# Patient Record
Sex: Male | Born: 1945
Health system: Southern US, Community
[De-identification: ages and names within clinical notes are randomized; demographics above are authoritative.]

## PROBLEM LIST (undated history)

## (undated) DIAGNOSIS — E785 Hyperlipidemia, unspecified: Secondary | ICD-10-CM

## (undated) DIAGNOSIS — I10 Essential (primary) hypertension: Secondary | ICD-10-CM

## (undated) HISTORY — PX: COLON SURGERY: SHX602

## (undated) HISTORY — DX: Hyperlipidemia, unspecified: E78.5

## (undated) HISTORY — PX: APPENDECTOMY: SHX54

---

## 2004-03-18 ENCOUNTER — Ambulatory Visit (HOSPITAL_COMMUNITY): Admission: RE | Admit: 2004-03-18 | Discharge: 2004-03-18 | Payer: Self-pay | Admitting: Internal Medicine

## 2007-10-07 ENCOUNTER — Ambulatory Visit (HOSPITAL_COMMUNITY): Admission: RE | Admit: 2007-10-07 | Discharge: 2007-10-07 | Payer: Self-pay | Admitting: Family Medicine

## 2007-10-14 ENCOUNTER — Ambulatory Visit: Payer: Self-pay | Admitting: Cardiology

## 2007-10-16 ENCOUNTER — Emergency Department (HOSPITAL_COMMUNITY): Admission: EM | Admit: 2007-10-16 | Discharge: 2007-10-16 | Payer: Self-pay | Admitting: Emergency Medicine

## 2007-10-17 ENCOUNTER — Ambulatory Visit: Payer: Self-pay | Admitting: Cardiology

## 2009-02-12 ENCOUNTER — Encounter: Payer: Self-pay | Admitting: Cardiology

## 2011-02-14 ENCOUNTER — Other Ambulatory Visit: Payer: Self-pay | Admitting: Family Medicine

## 2011-02-14 ENCOUNTER — Ambulatory Visit (HOSPITAL_COMMUNITY)
Admission: RE | Admit: 2011-02-14 | Discharge: 2011-02-14 | Disposition: A | Discharge status: Home / Self Care | Payer: BC Managed Care – PPO | Source: Ambulatory Visit | Attending: Family Medicine | Admitting: Family Medicine

## 2011-02-14 DIAGNOSIS — Z77090 Contact with and (suspected) exposure to asbestos: Secondary | ICD-10-CM

## 2011-02-14 NOTE — Procedures (Signed)
[facility]                              EXERCISE TREADMILL   Shane Young, Shane Young                    MRN:          [id number]  DATE:10/17/2007                            DOB:          [birth year]    REFERRING PHYSICIAN:  Scott A. Gerda Diss, MD   1. Treadmill exercise performed to a workload of 9 mets and a heart      rate of 151, 94% of the patient's age-predicted maximum.  Exercise      discontinued due to fatigue and mild dyspnea; no chest pain      reported.  2. Blood pressure increased from a resting value of 145/80 to 200/80      during exercise and 210/80 earlier in recovery, a minimally      hypertensive response.  No arrhythmias noted.  3. EKG:  Normal sinus rhythm; borderline left atrial abnormality;      right ventricular conduction delay; otherwise normal.   Stress EKG:  No significant change.   IMPRESSION:  Negative and adequate graded exercise test revealing no  evidence for myocardial ischemia.     Gerrit Friends. Dietrich Pates, MD, [facility]  Electronically Signed    RMR/MedQ  DD: 10/17/2007  DT: 10/17/2007  Job #: [id number]

## 2011-02-14 NOTE — Letter (Signed)
October 14, 2007    Scott A. Gerda Diss, MD  [address]  [place], [place] [address]   RE:  Shane Young, Shane Young  MRN:  [id number]  /  DOB:  1945-12-24   Dear Shane Young:   It was my pleasure evaluating [name] in the office today in  consultation at her request.  As you know, this nice gentleman has  enjoyed generally excellent health.  In recent weeks, he has experienced  two episodes of substernal chest discomfort.  These occurred at rest.  He describes the sensation as heartburn, but denies that the quality  is burning.  He has a difficult time describing exactly what the nature  of the sensation was.  There were no associated symptoms.  There was no  radiation.  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   [name] has never previously been evaluated by a cardiologist.  He  has not been told of any heart problems, nor has he had any prior  cardiac testing.  His general health is good.  He has had borderline  hypertension treated with very low dose lisinopril.  He had underwent  appendectomy as a child and intestinal surgery of some sort when he was  65 years of age.  He has had colonoscopy in recent years.   His only medicine in addition to lisinopril is aspirin.  He has no known  drug allergies.   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  He is  married with two children.  He has a remote history of perhaps 20 pack-  years of cigarette smoking.  He does not use excessive amounts of  alcohol.   FAMILY HISTORY:  Father is alive and well.  Mother died at age 33 due to  COPD.  One sister is alive and well.   REVIEW OF SYSTEMS:  Notable for the need for corrective lenses, stable  weight and appetite.  All other systems reviewed and are negative.   EXAM:  Pleasant trim  tanned gentleman in no acute distress.  The weight is 192.  Blood pressure 125/85, heart rate 75 and regular,  respirations 16.  NECK:  No jugular venous distention; normal carotid upstrokes without  bruits.  Afebrile.  EENT:  EOMs full; normal oral mucosa.  ENDOCRINE:  No thyromegaly.  HEMATOPOIETIC:  No adenopathy.  SKIN:  No significant lesions.  LUNGS:  Clear.  CARDIAC:  Normal first and second heart sounds; fourth heart sound  present; normal PMI.  ABDOMEN:  Soft and nontender; no organomegaly.  EXTREMITIES:  No edema.  NEUROLOGIC:  Symmetric strength; normal tone; normal cranial nerves.   Laboratory available from your office includes a normal CBC, normal  chemistry profile, normal amylase and normal cardiac markers.  The  patient reports that you advised him he will need lipid lowering  therapy, but your office insists that no lipid profiles are available.   EKG:  Normal sinus rhythm; leftward axis.  Nondiagnostic lateral Q  waves; incomplete right bundle branch block; left atrial abnormality;  slightly delayed R wave progression.  No tracing for comparison.   IMPRESSION:  [name] presents with two episodes of chest  discomfort but with generally excellent exercise tolerance and low  cardiovascular risk.  We will proceed with  a standard treadmill test,  which I fully expect to be negative.  If so, I would not proceed with  additional diagnostic testing.  I have recommended antacid should his  symptoms recur.  Thanks so much for sending this nice gentleman to me.    Sincerely,      Gerrit Friends. Dietrich Pates, MD, [facility]  Electronically Signed    RMR/MedQ  DD: 10/14/2007  DT: 10/14/2007  Job #: [id number]

## 2011-02-17 NOTE — Op Note (Signed)
NAME:  Shane Young, Shane Young                       ACCOUNT NO.:  [id number]   MEDICAL RECORD NO.:  [id number]                   PATIENT TYPE:  AMB   LOCATION:  DAY                                  FACILITY:  [facility]   PHYSICIAN:  R. Roetta Sessions, M.D.              DATE OF BIRTH:  1946/04/15   DATE OF PROCEDURE:  03/18/2004  DATE OF DISCHARGE:                                 OPERATIVE REPORT   PROCEDURE:  Colonoscopy with biopsy.   ENDOSCOPIST:  Gerrit Friends. Rourk, M.D.   INDICATIONS FOR PROCEDURE:  The patient is a 65 year old gentleman who comes  for colorectal cancer screening.  He had a sigmoidoscopy back in 1999 or so  without significant findings.  No family history of colorectal neoplasia. He  has never had a full colonoscopy.  He is devoid of any lower GI tract  symptoms.  Colonoscopy is now being done as a screening maneuver.  This  approach has been discussed with the patient at length. The potential risks,  benefits, and alternatives have been reviewed; questions answered.   PROCEDURE NOTE:  O2 saturation, blood pressure, pulse and respirations were  monitored throughout the entire procedure.  Conscious sedation:  Demerol 100  mg, Versed 4 mg in divided doses.   INSTRUMENT:  Olympus video chip system.   FINDINGS:  Digital rectal exam revealed no abnormalities.   ENDOSCOPIC FINDINGS:  The prep was good.   RECTUM:  Examination of the rectal mucosa including the retroflex view of  the anal verge revealed no abnormalities.   COLON:  The colonic mucosa was surveyed from the rectosigmoid junction  through the left transverse and right colon to the area of the appendiceal  orifice, ileocecal valve, and cecum.  These structures were well seen and  photographed for the record.   From this level the scope was slowly withdrawn.  All previously mentioned  mucosal surfaces were again seen.  The patient was noted to have a 3-mm  polyp at 30 cm which was cold biopsied/removed.  The  remainder of colonic  mucosa appeared normal.   The patient tolerated the procedure well and was reacted in endoscopy.   IMPRESSION:  1. Normal rectum.  2. Diminutive polyp at 30 cm cold biopsied/removed.  3. The remainder of the colonic mucosa appeared normal.   RECOMMENDATIONS:  1. Follow up on path.  2. Further recommendations to follow.      ___________________________________________                                            Jonathon Bellows, M.D.   RMR/MEDQ  D:  03/18/2004  T:  03/19/2004  Job:  [id number]   cc:   Dr. Cecille Aver. Roetta Sessions, M.D.  [address]  [place]  [place] [address]  Fax:  [phone] 

## 2011-06-22 LAB — CBC
MCV: 88
RBC: 5.23
WBC: 5.8

## 2011-06-22 LAB — DIFFERENTIAL
Eosinophils Absolute: 0.2
Lymphs Abs: 1.6
Monocytes Relative: 10
Neutro Abs: 3.4
Neutrophils Relative %: 58

## 2011-06-22 LAB — BASIC METABOLIC PANEL
CO2: 27
Calcium: 9.1
Potassium: 4.1
Sodium: 141

## 2011-06-22 LAB — POCT CARDIAC MARKERS
CKMB, poc: <1 — ABNORMAL LOW
Myoglobin, poc: 39.9
Myoglobin, poc: 42.8
Operator id: 218581

## 2013-01-27 ENCOUNTER — Telehealth: Payer: Self-pay | Admitting: Family Medicine

## 2013-01-27 NOTE — Telephone Encounter (Signed)
Patient needs BW paperwork mailed to him

## 2013-01-28 ENCOUNTER — Encounter: Payer: Self-pay | Admitting: *Deleted

## 2013-01-28 ENCOUNTER — Telehealth: Payer: Self-pay | Admitting: *Deleted

## 2013-01-28 ENCOUNTER — Other Ambulatory Visit: Payer: Self-pay | Admitting: *Deleted

## 2013-01-28 DIAGNOSIS — Z0000 Encounter for general adult medical examination without abnormal findings: Secondary | ICD-10-CM

## 2013-01-28 DIAGNOSIS — R739 Hyperglycemia, unspecified: Secondary | ICD-10-CM

## 2013-01-28 DIAGNOSIS — E785 Hyperlipidemia, unspecified: Secondary | ICD-10-CM

## 2013-01-28 DIAGNOSIS — Z125 Encounter for screening for malignant neoplasm of prostate: Secondary | ICD-10-CM

## 2013-01-28 NOTE — Telephone Encounter (Signed)
Left message to pt of blood work papers ordered

## 2013-01-28 NOTE — Telephone Encounter (Signed)
Metabolic 7, lipid profile, liver, hemoglobin A1c, PSA. Patient is Medicare age.diagnosis hyperlipidemia, hyperglycemia, screening for prostate cancer.

## 2013-02-11 ENCOUNTER — Other Ambulatory Visit: Payer: Self-pay | Admitting: Family Medicine

## 2013-02-11 LAB — HEPATIC FUNCTION PANEL
ALT: 20 U/L (ref 0–53)
AST: 18 U/L (ref 0–37)
Bilirubin, Direct: 0.1 mg/dL (ref 0.0–0.3)
Indirect Bilirubin: 0.5 mg/dL (ref 0.0–0.9)

## 2013-02-11 LAB — BASIC METABOLIC PANEL
BUN: 19 mg/dL (ref 6–23)
Calcium: 9.9 mg/dL (ref 8.4–10.5)
Creat: 0.94 mg/dL (ref 0.50–1.35)
Glucose, Bld: 98 mg/dL (ref 70–99)

## 2013-02-11 LAB — LIPID PANEL: Cholesterol: 229 mg/dL — ABNORMAL HIGH (ref 0–200)

## 2013-02-11 LAB — HEMOGLOBIN A1C: Mean Plasma Glucose: 105 mg/dL (ref <117)

## 2013-02-19 ENCOUNTER — Encounter: Payer: Self-pay | Admitting: Family Medicine

## 2013-02-19 ENCOUNTER — Ambulatory Visit (INDEPENDENT_AMBULATORY_CARE_PROVIDER_SITE_OTHER): Payer: MEDICARE | Admitting: Family Medicine

## 2013-02-19 VITALS — BP 132/77 | HR 80 | Ht 69.0 in | Wt 179.4 lb

## 2013-02-19 DIAGNOSIS — Z125 Encounter for screening for malignant neoplasm of prostate: Secondary | ICD-10-CM

## 2013-02-19 DIAGNOSIS — E785 Hyperlipidemia, unspecified: Secondary | ICD-10-CM | POA: Insufficient documentation

## 2013-02-19 DIAGNOSIS — I10 Essential (primary) hypertension: Secondary | ICD-10-CM

## 2013-02-19 DIAGNOSIS — Z0000 Encounter for general adult medical examination without abnormal findings: Secondary | ICD-10-CM

## 2013-02-19 MED ORDER — MOMETASONE FUROATE 0.1 % EX CREA: TOPICAL_CREAM | Freq: Every day | CUTANEOUS | Status: DC

## 2013-02-19 NOTE — Progress Notes (Signed)
  Subjective:    Patient ID: Shane Young, male    DOB: 1946/04/12, 67 y.o.   MRN: [id number]  HPIPatient denies any particular troubles. He does state he tries the healthy. He does have a spot on his hand he was concerned about. He also statesHe also has a dermatitis on his right lower leg that bothers him. He denies any sweats chills vomiting. Past medical history hyperlipidemia,Hypertension. Dietary measures, safety measures, exercise all reviewed. Patient does not smoke. They states in the past he was exposed to asbestos x-ray in 2012 looked good    Review of Systems  Constitutional: Negative for fever, activity change and appetite change.  HENT: Negative for congestion, rhinorrhea and neck pain.   Eyes: Negative for discharge.  Respiratory: Negative for cough and wheezing.   Cardiovascular: Negative for chest pain.  Gastrointestinal: Negative for vomiting, abdominal pain, constipation and blood in stool.  Genitourinary: Negative for frequency, hematuria and difficulty urinating.  Skin: Negative for rash.  Allergic/Immunologic: Negative for environmental allergies and food allergies.  Neurological: Negative for weakness and headaches.  Psychiatric/Behavioral: Negative for agitation.       Objective:   Physical Exam  Vitals reviewed. Constitutional: He is oriented to person, place, and time. He appears well-developed. No distress.  HENT:  Head: Normocephalic.  Neck: Normal range of motion. No JVD present.  Cardiovascular: Normal rate, regular rhythm and normal heart sounds.   No murmur heard. Pulmonary/Chest: Effort normal and breath sounds normal. No respiratory distress.  Abdominal: Soft. Bowel sounds are normal. There is tenderness.  Genitourinary: Rectum normal, prostate normal and penis normal.  Musculoskeletal: Normal range of motion.  Neurological: He is alert and oriented to person, place, and time.  Skin: Skin is warm and dry.          Assessment & Plan:   #1 wellness overall doing good continue regular activity physical activity dietary measures safety measures. #2 acanthoma on the right hand I recommend That he set himself up with Dr. Margo Aye for removal of this #3Blood pressure stable no changes #4 hyperlipidemia patient does not want to be on medication will watch diet #5 prostate exam is normal PSA ordered await the results #6 right leg contact dermatitis/atopic dermatitis-Elocon cream as directed.

## 2013-02-19 NOTE — Patient Instructions (Signed)
  Thank you for enrolling in MyChart. Please follow the instructions below to securely access your online medical record. MyChart allows you to send messages to your doctor, view your test results, manage appointments, and more.   How Do I Sign Up? 1. In your Internet browser, go to [employer] and enter [email or web address]. 2. Click on the Sign Up Now link in the Sign In box. You will see the New Member Sign Up page. 3. Enter your MyChart Access Code exactly as it appears below. You will not need to use this code after you've completed the sign-up process. If you do not sign up before the expiration date, you must request a new code. MyChart Access Code: [email or web address] Expires: 03/21/2013 11:59 AM  4. Enter your Social Security Number ([id number]) and Date of Birth (mm/dd/yyyy) as indicated and click Submit. You will be taken to the next sign-up page. 5. Create a MyChart ID. This will be your MyChart login ID and cannot be changed, so think of one that is secure and easy to remember. 6. Create a MyChart password. You can change your password at any time. 7. Enter your Password Reset Question and Answer. This can be used at a later time if you forget your password.  8. Enter your e-mail address. You will receive e-mail notification when new information is available in MyChart. 9. Click Sign Up. You can now view your medical record.   Additional Information Remember, MyChart is NOT to be used for urgent needs. For medical emergencies, dial 911.

## 2013-03-03 ENCOUNTER — Telehealth: Payer: Self-pay | Admitting: Family Medicine

## 2013-03-03 NOTE — Telephone Encounter (Signed)
Rx written and faxed to [facility].

## 2013-03-03 NOTE — Telephone Encounter (Signed)
lisinopril (PRINIVIL,ZESTRIL) 5 MG tablet  See chart for fax form to [facility]

## 2013-03-13 ENCOUNTER — Telehealth: Payer: Self-pay | Admitting: Family Medicine

## 2013-03-13 MED ORDER — LISINOPRIL 5 MG PO TABS: 5.0000 mg | ORAL_TABLET | Freq: Every day | ORAL | Status: DC

## 2013-03-13 NOTE — Telephone Encounter (Signed)
[facility] to [facility]. Lisinopril #90 one qd with one refill. Pt notified on voicemail

## 2013-03-13 NOTE — Telephone Encounter (Signed)
Patient says that [facility] says they did not receive the fax for his lisinopril.Shane Young Please recall this in .Shane Young Phone number [phone]

## 2013-08-17 ENCOUNTER — Other Ambulatory Visit: Payer: Self-pay | Admitting: Family Medicine

## 2013-11-15 ENCOUNTER — Other Ambulatory Visit: Payer: Self-pay | Admitting: Family Medicine

## 2013-12-01 ENCOUNTER — Telehealth: Payer: Self-pay | Admitting: Family Medicine

## 2013-12-01 DIAGNOSIS — E785 Hyperlipidemia, unspecified: Secondary | ICD-10-CM

## 2013-12-01 DIAGNOSIS — I10 Essential (primary) hypertension: Secondary | ICD-10-CM

## 2013-12-01 NOTE — Telephone Encounter (Signed)
Metabolic 7, lipid profile. (Not time yet for a PSA)

## 2013-12-01 NOTE — Telephone Encounter (Signed)
On 5/14: Lip, Liv, Met7, PSA, A1C

## 2013-12-01 NOTE — Telephone Encounter (Signed)
Pt bw orders for appt on 3/13

## 2013-12-02 ENCOUNTER — Other Ambulatory Visit: Payer: Self-pay | Admitting: *Deleted

## 2013-12-02 DIAGNOSIS — E785 Hyperlipidemia, unspecified: Secondary | ICD-10-CM

## 2013-12-02 DIAGNOSIS — I10 Essential (primary) hypertension: Secondary | ICD-10-CM

## 2013-12-02 NOTE — Telephone Encounter (Signed)
Notified patient via VM stating blood work orders are in. 

## 2013-12-08 LAB — BASIC METABOLIC PANEL
BUN: 20 mg/dL (ref 6–23)
CALCIUM: 9.3 mg/dL (ref 8.4–10.5)
CO2: 27 meq/L (ref 19–32)
CREATININE: 1.06 mg/dL (ref 0.50–1.35)
Chloride: 105 mEq/L (ref 96–112)
Glucose, Bld: 97 mg/dL (ref 70–99)
Potassium: 4.4 mEq/L (ref 3.5–5.3)
SODIUM: 140 meq/L (ref 135–145)

## 2013-12-08 LAB — LIPID PANEL
CHOLESTEROL: 230 mg/dL — AB (ref 0–200)
HDL: 47 mg/dL (ref >39)
LDL Cholesterol: 156 mg/dL — ABNORMAL HIGH (ref 0–99)
Total CHOL/HDL Ratio: 4.9 Ratio
Triglycerides: 135 mg/dL (ref <150)
VLDL: 27 mg/dL (ref 0–40)

## 2013-12-12 ENCOUNTER — Ambulatory Visit (INDEPENDENT_AMBULATORY_CARE_PROVIDER_SITE_OTHER): Payer: MEDICARE | Admitting: Family Medicine

## 2013-12-12 ENCOUNTER — Encounter: Payer: Self-pay | Admitting: Family Medicine

## 2013-12-12 VITALS — BP 126/78 | Ht 69.0 in | Wt 183.0 lb

## 2013-12-12 DIAGNOSIS — I10 Essential (primary) hypertension: Secondary | ICD-10-CM

## 2013-12-12 DIAGNOSIS — E785 Hyperlipidemia, unspecified: Secondary | ICD-10-CM

## 2013-12-12 MED ORDER — ATORVASTATIN CALCIUM 10 MG PO TABS: 10.0000 mg | ORAL_TABLET | Freq: Every day | ORAL | Status: DC

## 2013-12-12 NOTE — Progress Notes (Signed)
   Subjective:    Patient ID: Shane Young, male    DOB: 1946-01-19, 68 y.o.   MRN: [id number]  HPI Patient is here today for a check up.  He recently had his BW done.   No concerns/questions.  Denies any wheezing difficulty breathing he states he is trying to stay healthy. His lab work was discussed in detail including how his LDL is higher than what it should be. Warnings regarding how this could lead to heart disease and strokes was discussed he is at a 9.6% of major of the in the next 10 years   Review of Systems He denies chest tightness pressure pain shortness breath nausea vomiting diarrhea.    Objective:   Physical Exam Lungs clear heart regular pulse normal blood pressure recheck is good extremities no edema       Assessment & Plan:  #1 hyperlipidemia-according to the [employer] of [employer] he is at increased risk of a heart disease. I recommended that he change aspirin. Use 81 mg daily. Also start statin Lipitor 10 mg every single day. He is to start off 3 days per week for the first 2 weeks then 1 daily he is to check lipid liver profile for the in of May with a followup office visit in June 2 discussed.  #2 HTN good control

## 2013-12-12 NOTE — Patient Instructions (Signed)
Your cholesterol is higher than we need to be  Start Lipitor 10 mg- to begin one M W and Friday for 2 weeks Then one daily  Recheck labs end of May with follow up in June  Call us for labs near May 15th

## 2013-12-14 ENCOUNTER — Other Ambulatory Visit: Payer: Self-pay | Admitting: Family Medicine

## 2013-12-15 ENCOUNTER — Encounter: Payer: Self-pay | Admitting: *Deleted

## 2013-12-15 ENCOUNTER — Other Ambulatory Visit: Payer: Self-pay | Admitting: *Deleted

## 2013-12-18 ENCOUNTER — Telehealth: Payer: Self-pay | Admitting: Family Medicine

## 2013-12-18 MED ORDER — LISINOPRIL 5 MG PO TABS: ORAL_TABLET | ORAL | Status: DC

## 2013-12-18 NOTE — Telephone Encounter (Signed)
Patient needs Rx for lisinopril for a 90 day supply. [facility]

## 2013-12-18 NOTE — Telephone Encounter (Signed)
Rx sent electronically to pharmacy. Patient notified. 

## 2014-02-17 ENCOUNTER — Telehealth: Payer: Self-pay | Admitting: Family Medicine

## 2014-02-17 DIAGNOSIS — E782 Mixed hyperlipidemia: Secondary | ICD-10-CM

## 2014-02-17 DIAGNOSIS — Z125 Encounter for screening for malignant neoplasm of prostate: Secondary | ICD-10-CM

## 2014-02-17 NOTE — Telephone Encounter (Signed)
bw orders for appt on 6/11  Call when ready (spouse ordering hers as well so you can make 1 phone call)

## 2014-02-17 NOTE — Telephone Encounter (Signed)
PSA, lipid profile-diagnosis hyperlipidemia and screening

## 2014-02-17 NOTE — Telephone Encounter (Signed)
Left message on voicemail notifying patient that blood work has been ordered.  

## 2014-02-17 NOTE — Telephone Encounter (Signed)
Last labs 12/08/13 Lipid, Met 7. Repeat?

## 2014-03-07 ENCOUNTER — Other Ambulatory Visit: Payer: Self-pay | Admitting: Family Medicine

## 2014-03-07 LAB — LIPID PANEL
Cholesterol: 124 mg/dL (ref 0–200)
HDL: 40 mg/dL (ref >39)
LDL Cholesterol: 74 mg/dL (ref 0–99)
Total CHOL/HDL Ratio: 3.1 Ratio
Triglycerides: 51 mg/dL (ref <150)
VLDL: 10 mg/dL (ref 0–40)

## 2014-03-09 LAB — PSA: PSA: 1.89 ng/mL (ref <=4.00)

## 2014-03-12 ENCOUNTER — Ambulatory Visit (INDEPENDENT_AMBULATORY_CARE_PROVIDER_SITE_OTHER): Payer: MEDICARE | Admitting: Family Medicine

## 2014-03-12 ENCOUNTER — Encounter: Payer: Self-pay | Admitting: Family Medicine

## 2014-03-12 VITALS — BP 130/80 | Ht 69.0 in | Wt 176.2 lb

## 2014-03-12 DIAGNOSIS — Z23 Encounter for immunization: Secondary | ICD-10-CM

## 2014-03-12 DIAGNOSIS — I10 Essential (primary) hypertension: Secondary | ICD-10-CM

## 2014-03-12 DIAGNOSIS — E785 Hyperlipidemia, unspecified: Secondary | ICD-10-CM

## 2014-03-12 LAB — HEPATIC FUNCTION PANEL
ALBUMIN: 4.2 g/dL (ref 3.5–5.2)
ALT: 18 U/L (ref 0–53)
AST: 17 U/L (ref 0–37)
Alkaline Phosphatase: 53 U/L (ref 39–117)
BILIRUBIN TOTAL: 0.4 mg/dL (ref 0.2–1.2)
Bilirubin, Direct: 0.1 mg/dL (ref 0.0–0.3)
Indirect Bilirubin: 0.3 mg/dL (ref 0.2–1.2)
Total Protein: 6.6 g/dL (ref 6.0–8.3)

## 2014-03-12 MED ORDER — ATORVASTATIN CALCIUM 10 MG PO TABS: 10.0000 mg | ORAL_TABLET | Freq: Every day | ORAL | Status: DC

## 2014-03-12 MED ORDER — LISINOPRIL 5 MG PO TABS: ORAL_TABLET | ORAL | Status: DC

## 2014-03-12 NOTE — Progress Notes (Signed)
   Subjective:    Patient ID: Shane Young, male    DOB: 1946-09-21, 67 y.o.   MRN: [id number]  Hyperlipidemia This is a new problem. The current episode started more than 1 month ago. There are no known factors aggravating his hyperlipidemia. Pertinent negatives include no chest pain. Current antihyperlipidemic treatment includes statins. The current treatment provides significant improvement of lipids. There are no compliance problems.  Risk factors for coronary artery disease include hypertension.   Patient states that he has no concerns at this time.     Review of Systems  Constitutional: Negative for activity change, appetite change and fatigue.  HENT: Negative for congestion.   Respiratory: Negative for cough.   Cardiovascular: Negative for chest pain.  Gastrointestinal: Negative for abdominal pain.  Endocrine: Negative for polydipsia and polyphagia.  Neurological: Negative for weakness.  Psychiatric/Behavioral: Negative for confusion.       Objective:   Physical Exam  Vitals reviewed. Constitutional: He appears well-nourished. No distress.  Cardiovascular: Normal rate, regular rhythm and normal heart sounds.   No murmur heard. Pulmonary/Chest: Effort normal and breath sounds normal. No respiratory distress.  Genitourinary: Prostate normal.  Musculoskeletal: He exhibits no edema.  Lymphadenopathy:    He has no cervical adenopathy.  Neurological: He is alert.  Psychiatric: His behavior is normal.          Assessment & Plan:  1. Need   prophylactic vaccination against Streptococcus pneumoniae (pneumococcus) Flu shot today - Pneumococcal conjugate vaccine 13-valent IM  2. Essential hypertension, benign Blood pressure or doing well continue current medications  3. Hyperlipidemia Cholesterol doing great we'll recheck lipid liver profile several from now  Is due colonoscopy patient defers still

## 2014-07-09 ENCOUNTER — Telehealth: Payer: Self-pay

## 2014-07-09 NOTE — Telephone Encounter (Signed)
Pt's wife called today saying that she had called last Thursday about setting her husband up for a tcs per Dr Gerda DissLuking. I didn't see a phone note, but I told patient's wife that DS had been out and should return on Tuesday and I will make sure that she calls them back. Please call [phone]

## 2014-07-20 ENCOUNTER — Other Ambulatory Visit: Payer: Self-pay

## 2014-07-20 DIAGNOSIS — Z1211 Encounter for screening for malignant neoplasm of colon: Secondary | ICD-10-CM

## 2014-07-27 NOTE — Telephone Encounter (Signed)
Ok to schedule.

## 2014-07-27 NOTE — Telephone Encounter (Signed)
Gastroenterology Pre-Procedure Review  Request Date: 07/20/2014 Requesting Physician: Dr. Gerda DissLuking  PATIENT REVIEW QUESTIONS: The patient responded to the following health history questions as indicated:    Per wife, pt has approximately 2 beers daily  1. Diabetes Melitis: no 2. Joint replacements in the past 12 months: no 3. Major health problems in the past 3 months: no 4. Has an artificial valve or MVP: no 5. Has a defibrillator: no 6. Has been advised in past to take antibiotics in advance of a procedure like teeth cleaning: no    MEDICATIONS & ALLERGIES:    Patient reports the following regarding taking any blood thinners:   Plavix? no Aspirin? YES Coumadin? no  Patient confirms/reports the following medications:  Current Outpatient Prescriptions  Medication Sig Dispense Refill  . aspirin 81 MG tablet Take 81 mg by mouth daily.      Marland Kitchen. atorvastatin (LIPITOR) 10 MG tablet Take 1 tablet (10 mg total) by mouth daily.  90 tablet  2  . lisinopril (PRINIVIL,ZESTRIL) 5 MG tablet TAKE 1 TABLET DAILY  90 tablet  1  . mometasone (ELOCON) 0.1 % cream Apply topically daily.  45 g  2   No current facility-administered medications for this visit.    Patient confirms/reports the following allergies:  No Known Allergies  No orders of the defined types were placed in this encounter.    AUTHORIZATION INFORMATION Primary Insurance:   ID #:   Group #:  Pre-Cert / Auth required: Pre-Cert / Auth #:   Secondary Insurance:   ID #:   Group #:  Pre-Cert / Auth required Pre-Cert / Auth #:   SCHEDULE INFORMATION: Procedure has been scheduled as follows:  Date: 08/17/2014                  Time: 8:30 AM  Location[facility] Stay  This Gastroenterology Pre-Precedure Review Form is being routed to the following provider(s): R. Roetta SessionsMichael Rourk, MD

## 2014-07-27 NOTE — Addendum Note (Signed)
Addended by: Tiffany KocherLEWIS, LESLIE S on: 07/27/2014 10:45 PM   Modules accepted: Orders

## 2014-07-29 MED ORDER — PEG-KCL-NACL-NASULF-NA ASC-C 100 G PO SOLR: 1.0000 | ORAL | Status: DC

## 2014-07-29 NOTE — Telephone Encounter (Signed)
Rx sent to the pharmacy and instructions mailed to pt.  

## 2014-07-29 NOTE — Addendum Note (Signed)
Addended by: Lavena BullionSTEWART, Braylie Badami H on: 07/29/2014 08:43 AM   Modules accepted: Orders

## 2014-08-17 ENCOUNTER — Encounter (HOSPITAL_COMMUNITY)
Admission: RE | Disposition: A | Discharge status: Home / Self Care | Payer: Self-pay | Source: Ambulatory Visit | Attending: Internal Medicine

## 2014-08-17 ENCOUNTER — Encounter (HOSPITAL_COMMUNITY): Payer: Self-pay

## 2014-08-17 ENCOUNTER — Ambulatory Visit (HOSPITAL_COMMUNITY)
Admission: RE | Admit: 2014-08-17 | Discharge: 2014-08-17 | Disposition: A | Discharge status: Home / Self Care | Payer: Medicare Other | Source: Ambulatory Visit | Attending: Internal Medicine | Admitting: Internal Medicine

## 2014-08-17 DIAGNOSIS — Z1211 Encounter for screening for malignant neoplasm of colon: Secondary | ICD-10-CM | POA: Diagnosis not present

## 2014-08-17 DIAGNOSIS — I10 Essential (primary) hypertension: Secondary | ICD-10-CM | POA: Diagnosis not present

## 2014-08-17 DIAGNOSIS — E785 Hyperlipidemia, unspecified: Secondary | ICD-10-CM | POA: Diagnosis not present

## 2014-08-17 DIAGNOSIS — Z7982 Long term (current) use of aspirin: Secondary | ICD-10-CM | POA: Insufficient documentation

## 2014-08-17 HISTORY — PX: COLONOSCOPY: SHX5424

## 2014-08-17 HISTORY — DX: Essential (primary) hypertension: I10

## 2014-08-17 LAB — HM COLONOSCOPY

## 2014-08-17 SURGERY — COLONOSCOPY
Anesthesia: Moderate Sedation

## 2014-08-17 MED ORDER — MEPERIDINE HCL 100 MG/ML IJ SOLN
INTRAMUSCULAR | Status: AC
  Filled 2014-08-17: qty 2

## 2014-08-17 MED ORDER — ONDANSETRON HCL 4 MG/2ML IJ SOLN
INTRAMUSCULAR | Status: AC
  Filled 2014-08-17: qty 2

## 2014-08-17 MED ORDER — MEPERIDINE HCL 100 MG/ML IJ SOLN
INTRAMUSCULAR | Status: DC | PRN
  Administered 2014-08-17: 25 mg
  Administered 2014-08-17: 50 mg

## 2014-08-17 MED ORDER — MIDAZOLAM HCL 5 MG/5ML IJ SOLN
INTRAMUSCULAR | Status: DC | PRN
  Administered 2014-08-17: 1 mg via INTRAVENOUS
  Administered 2014-08-17 (×2): 2 mg via INTRAVENOUS
  Administered 2014-08-17: 1 mg via INTRAVENOUS

## 2014-08-17 MED ORDER — SODIUM CHLORIDE 0.9 % IV SOLN
INTRAVENOUS | Status: DC
  Administered 2014-08-17: 08:00:00 via INTRAVENOUS

## 2014-08-17 MED ORDER — ONDANSETRON HCL 4 MG/2ML IJ SOLN
INTRAMUSCULAR | Status: DC | PRN
  Administered 2014-08-17: 4 mg via INTRAVENOUS

## 2014-08-17 MED ORDER — STERILE WATER FOR IRRIGATION IR SOLN
Status: DC | PRN
  Administered 2014-08-17: 10:00:00

## 2014-08-17 MED ORDER — MIDAZOLAM HCL 5 MG/5ML IJ SOLN
INTRAMUSCULAR | Status: AC
  Filled 2014-08-17: qty 10

## 2014-08-17 NOTE — H&P (Signed)
$'@LOGO'x$ @   Primary Care Physician:  Sallee Lange, MD Primary Gastroenterologist:  Dr. Gala Romney  Pre-Procedure History & Physical: HPI:  Shane Young is a 68 y.o. male is here for a screening colonoscopy. Small polyp biopsy/removal 10 years ago. Pathology unavailable.No GI symptoms.  No family history of colon cancer.  Past Medical History  Diagnosis Date  . Hyperlipidemia   . Hypertension     Past Surgical History  Procedure Laterality Date  . Appendectomy    . Colon surgery      55 years ago     Prior to Admission medications   Medication Sig Start Date End Date Taking? Authorizing Provider  aspirin 81 MG tablet Take 81 mg by mouth daily.   Yes Historical Provider, MD  atorvastatin (LIPITOR) 10 MG tablet Take 1 tablet (10 mg total) by mouth daily. 03/12/14  Yes Kathyrn Drown, MD  lisinopril (PRINIVIL,ZESTRIL) 5 MG tablet TAKE 1 TABLET DAILY Patient taking differently: Take 5 mg by mouth daily.  03/12/14  Yes Kathyrn Drown, MD  peg 3350 powder (MOVIPREP) 100 G SOLR Take 1 kit (200 g total) by mouth as directed. 07/29/14  Yes Daneil Dolin, MD    Allergies as of 07/20/2014  . (No Known Allergies)    Family History  Problem Relation Age of Onset  . Hypertension Mother   . Diabetes Mother     History   Social History  . Marital Status: Married    Spouse Name: N/A    Number of Children: N/A  . Years of Education: N/A   Occupational History  . Not on file.   Social History Main Topics  . Smoking status: Never Smoker   . Smokeless tobacco: Not on file  . Alcohol Use: Yes  . Drug Use: No  . Sexual Activity: Not on file   Other Topics Concern  . Not on file   Social History Narrative    Review of Systems: See HPI, otherwise negative ROS  Physical Exam: BP 140/79 mmHg  Pulse 81  Temp(Src) 97.7 F (36.5 C) (Oral)  Resp 22  Ht $R'5\' 9"'oe$  (1.753 m)  Wt 183 lb (83.008 kg)  BMI 27.01 kg/m2  SpO2 98% General:   Alert,  Well-developed, well-nourished,  pleasant and cooperative in NAD Head:  Normocephalic and atraumatic. Eyes:  Sclera clear, no icterus.   Conjunctiva pink. Ears:  Normal auditory acuity. Nose:  No deformity, discharge,  or lesions. Mouth:  No deformity or lesions, dentition normal. Neck:  Supple; no masses or thyromegaly. Lungs:  Clear throughout to auscultation.   No wheezes, crackles, or rhonchi. No acute distress. Heart:  Regular rate and rhythm; no murmurs, clicks, rubs,  or gallops. Abdomen:  Soft, nontender and nondistended. No masses, hepatosplenomegaly or hernias noted. Normal bowel sounds, without guarding, and without rebound.   Msk:  Symmetrical without gross deformities. Normal posture. Pulses:  Normal pulses noted. Extremities:  Without clubbing or edema. Neurologic:  Alert and  oriented x4;  grossly normal neurologically. Skin:  Intact without significant lesions or rashes. Cervical Nodes:  No significant cervical adenopathy. Psych:  Alert and cooperative. Normal mood and affect.  Impression/Plan: Shane Young is now here to undergo a screening colonoscopy.  Average risk screening examination Risks, benefits, limitations, imponderables and alternatives regarding colonoscopy have been reviewed with the patient. Questions have been answered. All parties agreeable.     Notice:  This dictation was prepared with Dragon dictation along with smaller phrase technology. Any transcriptional errors  that result from this process are unintentional and may not be corrected upon review.

## 2014-08-17 NOTE — Discharge Instructions (Addendum)
°  Colonoscopy Discharge Instructions  Read the instructions outlined below and refer to this sheet in the next few weeks. These discharge instructions provide you with general information on caring for yourself after you leave the hospital. Your doctor may also give you specific instructions. While your treatment has been planned according to the most current medical practices available, unavoidable complications occasionally occur. If you have any problems or questions after discharge, call Dr. Jena Gaussourk at [phone]. ACTIVITY  You may resume your regular activity, but move at a slower pace for the next 24 hours.   Take frequent rest periods for the next 24 hours.   Walking will help get rid of the air and reduce the bloated feeling in your belly (abdomen).   No driving for 24 hours (because of the medicine (anesthesia) used during the test).    Do not sign any important legal documents or operate any machinery for 24 hours (because of the anesthesia used during the test).  NUTRITION  Drink plenty of fluids.   You may resume your normal diet as instructed by your doctor.   Begin with a light meal and progress to your normal diet. Heavy or fried foods are harder to digest and may make you feel sick to your stomach (nauseated).   Avoid alcoholic beverages for 24 hours or as instructed.  MEDICATIONS  You may resume your normal medications unless your doctor tells you otherwise.  WHAT YOU CAN EXPECT TODAY  Some feelings of bloating in the abdomen.   Passage of more gas than usual.   Spotting of blood in your stool or on the toilet paper.  IF YOU HAD POLYPS REMOVED DURING THE COLONOSCOPY:  No aspirin products for 7 days or as instructed.   No alcohol for 7 days or as instructed.   Eat a soft diet for the next 24 hours.  FINDING OUT THE RESULTS OF YOUR TEST Not all test results are available during your visit. If your test results are not back during the visit, make an appointment  with your caregiver to find out the results. Do not assume everything is normal if you have not heard from your caregiver or the medical facility. It is important for you to follow up on all of your test results.  SEEK IMMEDIATE MEDICAL ATTENTION IF:  You have more than a spotting of blood in your stool.   Your belly is swollen (abdominal distention).   You are nauseated or vomiting.   You have a temperature over 101.   You have abdominal pain or discomfort that is severe or gets worse throughout the day.    Your colonoscopy was normal  I recommend one more colonoscopy in 10 years if your overall health permits

## 2014-08-17 NOTE — Op Note (Signed)
[facility] [address] [place], [id number]   COLONOSCOPY PROCEDURE REPORT  PATIENT: Shane LusterHenderson, Joshuah L  MR#: [id number] BIRTHDATE: 08-20-1946 , 68  yrs. old GENDER: male ENDOSCOPIST: R.  Roetta SessionsMichael Kassaundra Hair, MD FACP [facility] REFERRED GE:XBMWUBY:Scott Gerda DissLuking, M.D. PROCEDURE DATE:  08/17/2014 PROCEDURE:   Colonoscopy, screening INDICATIONS:Average risk colorectal cancer screening examination. MEDICATIONS: Versed 6 mg IV and Demerol 75 mg IV in divided doses. Zofran 4 mg IV. ASA CLASS:       Class II  CONSENT: The risks, benefits, alternatives and imponderables including but not limited to bleeding, perforation as well as the possibility of a missed lesion have been reviewed.  The potential for biopsy, lesion removal, etc. have also been discussed. Questions have been answered.  All parties agreeable.  Please see the history and physical in the medical record for more information.  DESCRIPTION OF PROCEDURE:   After the risks benefits and alternatives of the procedure were thoroughly explained, informed consent was obtained.  The digital rectal exam revealed no abnormalities of the rectum.   The EC-[id number] ([id number]  endoscope was introduced through the anus and advanced to the cecum, which was identified by both the appendix and ileocecal valve. No adverse events experienced.   The quality of the prep was adequate.  The instrument was then slowly withdrawn as the colon was fully examined.      COLON FINDINGS: Normal-appearing rectal mucosa.  Normal-appearing colonic mucosa.  Retroflexion was performed. .  Withdrawal time=9 minutes 0 seconds.  The scope was withdrawn and the procedure completed. COMPLICATIONS: There were no immediate complications.  ENDOSCOPIC IMPRESSION: Normal colonoscopy  RECOMMENDATIONS: One more screening colonoscopy in 10 years if overall health permits.  eSigned:  R. Roetta SessionsMichael Verley Pariseau, MD Jerrel IvoryFACP [facility] 08/17/2014 10:11 AM   cc:  CPT CODES: ICD  CODES:  The ICD and CPT codes recommended by this software are interpretations from the data that the clinical staff has captured with the software.  The verification of the translation of this report to the ICD and CPT codes and modifiers is the sole responsibility of the health care institution and practicing physician where this report was generated.  [employer] will not be held responsible for the validity of the ICD and CPT codes included on this report.  AMA assumes no liability for data contained or not contained herein. CPT is a Publishing rights managerregistered trademark of the [employer].

## 2014-08-18 ENCOUNTER — Encounter (HOSPITAL_COMMUNITY): Payer: Self-pay | Admitting: Internal Medicine

## 2014-08-18 ENCOUNTER — Telehealth: Payer: Self-pay | Admitting: Family Medicine

## 2014-08-18 DIAGNOSIS — Z79899 Other long term (current) drug therapy: Secondary | ICD-10-CM

## 2014-08-18 DIAGNOSIS — D649 Anemia, unspecified: Secondary | ICD-10-CM

## 2014-08-18 DIAGNOSIS — E785 Hyperlipidemia, unspecified: Secondary | ICD-10-CM

## 2014-08-18 NOTE — Telephone Encounter (Signed)
I would recommend lipid profile, metabolic 7, CBC-HTN, hyperlipidemia, use of aspirin ( high risk medicine could cause anemia)

## 2014-08-18 NOTE — Telephone Encounter (Signed)
Pt has appt 12/16 needs bw orders for appt  Please call when ready LM on VM if no answer   Hep PSA Lipid were last labs ran 6/6

## 2014-08-19 NOTE — Telephone Encounter (Signed)
Notified patient's wife that bloodwork has been ordered.

## 2014-08-22 ENCOUNTER — Other Ambulatory Visit: Payer: Self-pay | Admitting: Family Medicine

## 2014-09-01 LAB — CBC WITH DIFFERENTIAL/PLATELET
Basophils Absolute: 0 10*3/uL (ref 0.0–0.1)
Basophils Relative: 0 % (ref 0–1)
Eosinophils Absolute: 0.3 10*3/uL (ref 0.0–0.7)
Eosinophils Relative: 5 % (ref 0–5)
HEMATOCRIT: 44.8 % (ref 39.0–52.0)
Hemoglobin: 15.4 g/dL (ref 13.0–17.0)
Lymphocytes Relative: 29 % (ref 12–46)
Lymphs Abs: 1.8 10*3/uL (ref 0.7–4.0)
MCH: 30.1 pg (ref 26.0–34.0)
MCHC: 34.4 g/dL (ref 30.0–36.0)
MCV: 87.7 fL (ref 78.0–100.0)
MONO ABS: 0.6 10*3/uL (ref 0.1–1.0)
MONOS PCT: 10 % (ref 3–12)
MPV: 8.6 fL — ABNORMAL LOW (ref 9.4–12.4)
NEUTROS ABS: 3.5 10*3/uL (ref 1.7–7.7)
NEUTROS PCT: 56 % (ref 43–77)
Platelets: 287 10*3/uL (ref 150–400)
RBC: 5.11 MIL/uL (ref 4.22–5.81)
RDW: 13.2 % (ref 11.5–15.5)
WBC: 6.2 10*3/uL (ref 4.0–10.5)

## 2014-09-01 LAB — LIPID PANEL
CHOL/HDL RATIO: 2.6 ratio
Cholesterol: 129 mg/dL (ref 0–200)
HDL: 50 mg/dL (ref >39)
LDL CALC: 64 mg/dL (ref 0–99)
Triglycerides: 77 mg/dL (ref <150)
VLDL: 15 mg/dL (ref 0–40)

## 2014-09-01 LAB — BASIC METABOLIC PANEL
BUN: 12 mg/dL (ref 6–23)
CO2: 27 mEq/L (ref 19–32)
Calcium: 8.9 mg/dL (ref 8.4–10.5)
Chloride: 107 mEq/L (ref 96–112)
Creat: 1.01 mg/dL (ref 0.50–1.35)
GLUCOSE: 110 mg/dL — AB (ref 70–99)
Potassium: 4.7 mEq/L (ref 3.5–5.3)
SODIUM: 141 meq/L (ref 135–145)

## 2014-09-16 ENCOUNTER — Encounter: Payer: Self-pay | Admitting: Family Medicine

## 2014-09-16 ENCOUNTER — Ambulatory Visit (INDEPENDENT_AMBULATORY_CARE_PROVIDER_SITE_OTHER): Payer: MEDICARE | Admitting: Family Medicine

## 2014-09-16 VITALS — BP 138/72 | Ht 69.0 in | Wt 183.0 lb

## 2014-09-16 DIAGNOSIS — E785 Hyperlipidemia, unspecified: Secondary | ICD-10-CM

## 2014-09-16 DIAGNOSIS — I10 Essential (primary) hypertension: Secondary | ICD-10-CM

## 2014-09-16 DIAGNOSIS — R739 Hyperglycemia, unspecified: Secondary | ICD-10-CM

## 2014-09-16 LAB — POCT GLYCOSYLATED HEMOGLOBIN (HGB A1C): HEMOGLOBIN A1C: 5.4

## 2014-09-16 NOTE — Progress Notes (Signed)
   Subjective:    Patient ID: Shane Young, male    DOB: [birth year], 68 y.o.   MRN: [id number]  Hypertension This is a chronic problem. The current episode started more than 1 year ago. Pertinent negatives include no chest pain.   this patient does try to watch how he eats he he does admit to eating too much sugars and candies. Wants to go over BW results.  This patient [place] but does not do any walking or purposeful exercise he denies chest tightness pressure pain or shortness of breath he does take his cholesterol medicine and tolerates it well no muscle aches or pain he is up-to-date on his wellness. Patient does not do flu vaccine. He refuses it. Review of Systems  Constitutional: Negative for activity change, appetite change and fatigue.  HENT: Negative for congestion.   Respiratory: Negative for cough.   Cardiovascular: Negative for chest pain.  Gastrointestinal: Negative for abdominal pain.  Endocrine: Negative for polydipsia and polyphagia.  Neurological: Negative for weakness.  Psychiatric/Behavioral: Negative for confusion.       Objective:   Physical Exam  Constitutional: He appears well-nourished. No distress.  Cardiovascular: Normal rate, regular rhythm and normal heart sounds.   No murmur heard. Pulmonary/Chest: Effort normal and breath sounds normal. No respiratory distress.  Musculoskeletal: He exhibits no edema.  Lymphadenopathy:    He has no cervical adenopathy.  Neurological: He is alert.  Psychiatric: His behavior is normal.  Vitals reviewed.         Assessment & Plan:

## 2014-11-14 ENCOUNTER — Other Ambulatory Visit: Payer: Self-pay | Admitting: Family Medicine

## 2014-11-17 ENCOUNTER — Telehealth: Payer: Self-pay | Admitting: Family Medicine

## 2014-11-17 ENCOUNTER — Other Ambulatory Visit: Payer: Self-pay | Admitting: *Deleted

## 2014-11-17 MED ORDER — LISINOPRIL 5 MG PO TABS: 5.0000 mg | ORAL_TABLET | Freq: Every day | ORAL | Status: DC

## 2014-11-17 MED ORDER — ATORVASTATIN CALCIUM 10 MG PO TABS: 10.0000 mg | ORAL_TABLET | Freq: Every day | ORAL | Status: DC

## 2014-11-17 NOTE — Telephone Encounter (Signed)
Scripts placed in the mail. Pt's wife notified.

## 2014-11-17 NOTE — Telephone Encounter (Signed)
Patient changed pharmacy and needs new prescription on astorvastatin 10 mg 90 day supply, lisinopril 5 mg 90 day supply. Please mail prescriptions.

## 2015-03-16 ENCOUNTER — Encounter: Payer: Self-pay | Admitting: Family Medicine

## 2015-03-17 ENCOUNTER — Other Ambulatory Visit: Payer: Self-pay | Admitting: Family Medicine

## 2015-03-17 MED ORDER — ATORVASTATIN CALCIUM 10 MG PO TABS: 10.0000 mg | ORAL_TABLET | Freq: Every day | ORAL | Status: DC

## 2015-03-17 NOTE — Addendum Note (Signed)
Addended by: Dereck Ligas on: 03/17/2015 09:38 AM   Modules accepted: Orders

## 2015-03-17 NOTE — Telephone Encounter (Signed)
Needs office visit.

## 2015-04-10 ENCOUNTER — Other Ambulatory Visit: Payer: Self-pay | Admitting: Family Medicine

## 2015-04-12 NOTE — Telephone Encounter (Signed)
Needs office visit.

## 2015-04-13 ENCOUNTER — Telehealth: Payer: Self-pay | Admitting: Family Medicine

## 2015-04-13 DIAGNOSIS — I10 Essential (primary) hypertension: Secondary | ICD-10-CM

## 2015-04-13 DIAGNOSIS — R739 Hyperglycemia, unspecified: Secondary | ICD-10-CM

## 2015-04-13 DIAGNOSIS — Z125 Encounter for screening for malignant neoplasm of prostate: Secondary | ICD-10-CM

## 2015-04-13 DIAGNOSIS — Z79899 Other long term (current) drug therapy: Secondary | ICD-10-CM

## 2015-04-13 DIAGNOSIS — E785 Hyperlipidemia, unspecified: Secondary | ICD-10-CM

## 2015-04-13 NOTE — Telephone Encounter (Signed)
Also PSA 

## 2015-04-13 NOTE — Telephone Encounter (Signed)
bw orders ready. Pt notified. 

## 2015-04-13 NOTE — Telephone Encounter (Signed)
Lipid, liver, metabolic 7, hemoglobin A1c 

## 2015-04-13 NOTE — Telephone Encounter (Signed)
Pt is requesting lab orders to be sent over for his upcoming appt. Last labs per epic were: lipid,bmp,and cbc on 09/01/14

## 2015-04-17 LAB — HEPATIC FUNCTION PANEL
ALBUMIN: 4.5 g/dL (ref 3.6–4.8)
ALT: 18 IU/L (ref 0–44)
AST: 17 IU/L (ref 0–40)
Alkaline Phosphatase: 57 IU/L (ref 39–117)
Bilirubin Total: 0.4 mg/dL (ref 0.0–1.2)
Bilirubin, Direct: 0.15 mg/dL (ref 0.00–0.40)
Total Protein: 6.6 g/dL (ref 6.0–8.5)

## 2015-04-17 LAB — BASIC METABOLIC PANEL
BUN/Creatinine Ratio: 19 (ref 10–22)
BUN: 20 mg/dL (ref 8–27)
CHLORIDE: 102 mmol/L (ref 97–108)
CO2: 21 mmol/L (ref 18–29)
CREATININE: 1.03 mg/dL (ref 0.76–1.27)
Calcium: 9.4 mg/dL (ref 8.6–10.2)
GFR calc non Af Amer: 74 mL/min/{1.73_m2} (ref >59)
GFR, EST AFRICAN AMERICAN: 86 mL/min/{1.73_m2} (ref >59)
Glucose: 103 mg/dL — ABNORMAL HIGH (ref 65–99)
Potassium: 5 mmol/L (ref 3.5–5.2)
Sodium: 139 mmol/L (ref 134–144)

## 2015-04-17 LAB — LIPID PANEL
CHOLESTEROL TOTAL: 139 mg/dL (ref 100–199)
Chol/HDL Ratio: 2.7 ratio units (ref 0.0–5.0)
HDL: 52 mg/dL (ref >39)
LDL Calculated: 74 mg/dL (ref 0–99)
TRIGLYCERIDES: 65 mg/dL (ref 0–149)
VLDL Cholesterol Cal: 13 mg/dL (ref 5–40)

## 2015-04-17 LAB — HEMOGLOBIN A1C
ESTIMATED AVERAGE GLUCOSE: 114 mg/dL
Hgb A1c MFr Bld: 5.6 % (ref 4.8–5.6)

## 2015-04-17 LAB — PSA: PROSTATE SPECIFIC AG, SERUM: 2.9 ng/mL (ref 0.0–4.0)

## 2015-04-22 ENCOUNTER — Encounter: Payer: Self-pay | Admitting: Family Medicine

## 2015-04-22 ENCOUNTER — Ambulatory Visit (INDEPENDENT_AMBULATORY_CARE_PROVIDER_SITE_OTHER): Payer: Medicare Other | Admitting: Family Medicine

## 2015-04-22 VITALS — BP 132/80 | Ht 69.0 in | Wt 181.0 lb

## 2015-04-22 DIAGNOSIS — Z0000 Encounter for general adult medical examination without abnormal findings: Secondary | ICD-10-CM | POA: Diagnosis not present

## 2015-04-22 DIAGNOSIS — Z23 Encounter for immunization: Secondary | ICD-10-CM

## 2015-04-22 MED ORDER — ATORVASTATIN CALCIUM 10 MG PO TABS: 10.0000 mg | ORAL_TABLET | Freq: Every day | ORAL | Status: DC

## 2015-04-22 MED ORDER — LISINOPRIL 5 MG PO TABS: 5.0000 mg | ORAL_TABLET | Freq: Every day | ORAL | Status: DC

## 2015-04-22 NOTE — Progress Notes (Signed)
   Subjective:    Patient ID: Shane Young, male    DOB: 03-23-1946, 69 y.o.   MRN: [id number]  HPI AWV- Annual Wellness Visit  The patient was seen for their annual wellness visit. The patient's past medical history, surgical history, and family history were reviewed. Pertinent vaccines were reviewed ( tetanus, pneumonia, shingles, flu) The patient's medication list was reviewed and updated.  The height and weight were entered. The patient's current BMI is: 26.73  Cognitive screening was completed. Outcome of Mini - Cog: pass  Falls within the past 6 months: none  Current tobacco usage: chews tobacco (All patients who use tobacco were given written and verbal information on quitting)  Recent listing of emergency department/hospitalizations over the past year were reviewed.  current specialist the patient sees on a regular basis: none   Medicare annual wellness visit patient questionnaire was reviewed.  A written screening schedule for the patient for the next 5-10 years was given. Appropriate discussion of followup regarding next visit was discussed.  Pt states no concerns today. Had bloodwork done on 7/15.     Review of Systems  Constitutional: Negative for fever, activity change and appetite change.  HENT: Negative for congestion and rhinorrhea.   Eyes: Negative for discharge.  Respiratory: Negative for cough and wheezing.   Cardiovascular: Negative for chest pain.  Gastrointestinal: Negative for vomiting, abdominal pain and blood in stool.  Genitourinary: Negative for frequency and difficulty urinating.  Musculoskeletal: Negative for neck pain.  Skin: Negative for rash.  Allergic/Immunologic: Negative for environmental allergies and food allergies.  Neurological: Negative for weakness and headaches.  Psychiatric/Behavioral: Negative for agitation.       Objective:   Physical Exam  Constitutional: He appears well-developed and well-nourished.  HENT:  Head:  Normocephalic and atraumatic.  Right Ear: External ear normal.  Left Ear: External ear normal.  Nose: Nose normal.  Mouth/Throat: Oropharynx is clear and moist.  Eyes: EOM are normal. Pupils are equal, round, and reactive to light.  Neck: Normal range of motion. Neck supple. No thyromegaly present.  Cardiovascular: Normal rate, regular rhythm and normal heart sounds.   No murmur heard. Pulmonary/Chest: Effort normal and breath sounds normal. No respiratory distress. He has no wheezes.  Abdominal: Soft. Bowel sounds are normal. He exhibits no distension and no mass. There is no tenderness.  Genitourinary: Prostate normal and penis normal.  Musculoskeletal: Normal range of motion. He exhibits no edema.  Lymphadenopathy:    He has no cervical adenopathy.  Neurological: He is alert. He exhibits normal muscle tone.  Skin: Skin is warm and dry. No erythema.  Psychiatric: He has a normal mood and affect. His behavior is normal. Judgment normal.    Overall patient's lab work looks very good Blood pressure cholesterol stable     Assessment & Plan:  Wellness Patient overall doing good Safety dietary discussed Follow-up in 6 months for standard medical visit Pneumonia vaccine given

## 2015-06-18 ENCOUNTER — Emergency Department (HOSPITAL_COMMUNITY): Payer: Medicare Other

## 2015-06-18 ENCOUNTER — Encounter (HOSPITAL_COMMUNITY): Payer: Self-pay

## 2015-06-18 ENCOUNTER — Emergency Department (HOSPITAL_COMMUNITY)
Admission: EM | Admit: 2015-06-18 | Discharge: 2015-06-18 | Disposition: A | Discharge status: Home / Self Care | Payer: Medicare Other | Attending: Emergency Medicine | Admitting: Emergency Medicine

## 2015-06-18 DIAGNOSIS — R200 Anesthesia of skin: Secondary | ICD-10-CM | POA: Diagnosis present

## 2015-06-18 DIAGNOSIS — R109 Unspecified abdominal pain: Secondary | ICD-10-CM | POA: Insufficient documentation

## 2015-06-18 DIAGNOSIS — R202 Paresthesia of skin: Secondary | ICD-10-CM | POA: Insufficient documentation

## 2015-06-18 DIAGNOSIS — E785 Hyperlipidemia, unspecified: Secondary | ICD-10-CM | POA: Insufficient documentation

## 2015-06-18 DIAGNOSIS — Z7982 Long term (current) use of aspirin: Secondary | ICD-10-CM | POA: Diagnosis not present

## 2015-06-18 DIAGNOSIS — R0602 Shortness of breath: Secondary | ICD-10-CM | POA: Diagnosis not present

## 2015-06-18 DIAGNOSIS — Z79899 Other long term (current) drug therapy: Secondary | ICD-10-CM | POA: Insufficient documentation

## 2015-06-18 DIAGNOSIS — I10 Essential (primary) hypertension: Secondary | ICD-10-CM | POA: Diagnosis not present

## 2015-06-18 DIAGNOSIS — R079 Chest pain, unspecified: Secondary | ICD-10-CM | POA: Diagnosis not present

## 2015-06-18 DIAGNOSIS — M542 Cervicalgia: Secondary | ICD-10-CM | POA: Insufficient documentation

## 2015-06-18 LAB — CBC WITH DIFFERENTIAL/PLATELET
Basophils Absolute: 0 10*3/uL (ref 0.0–0.1)
Basophils Relative: 0 %
EOS ABS: 0.3 10*3/uL (ref 0.0–0.7)
EOS PCT: 5 %
HCT: 44.1 % (ref 39.0–52.0)
Hemoglobin: 15.2 g/dL (ref 13.0–17.0)
LYMPHS ABS: 2.1 10*3/uL (ref 0.7–4.0)
Lymphocytes Relative: 31 %
MCH: 30.8 pg (ref 26.0–34.0)
MCHC: 34.5 g/dL (ref 30.0–36.0)
MCV: 89.5 fL (ref 78.0–100.0)
MONO ABS: 0.6 10*3/uL (ref 0.1–1.0)
Monocytes Relative: 8 %
Neutro Abs: 3.9 10*3/uL (ref 1.7–7.7)
Neutrophils Relative %: 56 %
PLATELETS: 253 10*3/uL (ref 150–400)
RBC: 4.93 MIL/uL (ref 4.22–5.81)
RDW: 13 % (ref 11.5–15.5)
WBC: 6.9 10*3/uL (ref 4.0–10.5)

## 2015-06-18 LAB — BASIC METABOLIC PANEL
Anion gap: 6 (ref 5–15)
BUN: 16 mg/dL (ref 6–20)
CALCIUM: 8.8 mg/dL — AB (ref 8.9–10.3)
CO2: 26 mmol/L (ref 22–32)
CREATININE: 1.03 mg/dL (ref 0.61–1.24)
Chloride: 108 mmol/L (ref 101–111)
GFR calc Af Amer: >60 mL/min (ref >60)
Glucose, Bld: 115 mg/dL — ABNORMAL HIGH (ref 65–99)
POTASSIUM: 4.4 mmol/L (ref 3.5–5.1)
SODIUM: 140 mmol/L (ref 135–145)

## 2015-06-18 LAB — TROPONIN I

## 2015-06-18 MED ORDER — LORAZEPAM 2 MG/ML IJ SOLN
1.0000 mg | INTRAMUSCULAR | Status: DC | PRN
  Filled 2015-06-18: qty 1

## 2015-06-18 MED ORDER — IOHEXOL 350 MG/ML SOLN
75.0000 mL | Freq: Once | INTRAVENOUS | Status: AC | PRN
  Administered 2015-06-18: 75 mL via INTRAVENOUS

## 2015-06-18 MED ORDER — LORAZEPAM 2 MG/ML IJ SOLN
0.5000 mg | Freq: Once | INTRAMUSCULAR | Status: AC
  Administered 2015-06-18: 0.5 mg via INTRAVENOUS
  Filled 2015-06-18: qty 1

## 2015-06-18 NOTE — ED Notes (Signed)
Pt reports woke up at 4 am with stiff neck, pain in left side of abd, and  numbness in left arm and left leg .  Pt describes as a "tingling" feeling.

## 2015-06-18 NOTE — ED Provider Notes (Signed)
CSN: [id number]     Arrival date & time 06/18/15  4098 History  This chart was scribed for Rolland Porter, MD by Elon Spanner, ED Scribe. This patient was seen in room [facility] and the patient's care was started at 8:55 AM.   Chief Complaint  Patient presents with  . Abdominal Pain  . Numbness   The history is provided by the patient. No language interpreter was used.   HPI Comments: KOLLIN UDELL is a 69 y.o. male with hx of HTN (lisinopril), HLD (lipitor) who presents to the Emergency Department complaining of sudden, sharp, brief (several seconds) left lower CP onset 4:00 am while sleeping.  The patient returned to sleep and woke with associated symptoms includin mild, currently present tingling in the left leg and left arm as well as resolved stiffness in left-sided neck.  He reports a prior hx of similar symptoms 17 years ago that was attributed to stress.  However, last week, the patient had an episode of SOB and CP while shoveling.  The episode resolved relatively quickly after the patient ate lunch.  Patient is not a current smoker.  He denies hx of familiar cardiac conditions or unexplained, young familial deaths. He denies difficulty ambulating.  He denies SOB, abdominal pain, n/v.   Past Medical History  Diagnosis Date  . Hyperlipidemia   . Hypertension    Past Surgical History  Procedure Laterality Date  . Appendectomy    . Colon surgery      55 years ago   . Colonoscopy N/A 08/17/2014    Procedure: COLONOSCOPY;  Surgeon: Corbin Ade, MD;  Location: [facility];  Service: Endoscopy;  Laterality: N/A;  8:30 AM   Family History  Problem Relation Age of Onset  . Hypertension Mother   . Diabetes Mother    Social History  Substance Use Topics  . Smoking status: Never Smoker   . Smokeless tobacco: Current User    Types: Chew  . Alcohol Use: 0.0 oz/week    0 Standard drinks or equivalent per week     Comment: 2-3 beers/day    Review of Systems  Constitutional:  Negative for fever, chills, diaphoresis, appetite change and fatigue.  HENT: Negative for mouth sores, sore throat and trouble swallowing.   Eyes: Negative for visual disturbance.  Respiratory: Positive for shortness of breath. Negative for cough, chest tightness and wheezing.   Cardiovascular: Positive for chest pain.  Gastrointestinal: Negative for nausea, vomiting, abdominal pain, diarrhea and abdominal distention.  Endocrine: Negative for polydipsia, polyphagia and polyuria.  Genitourinary: Negative for dysuria, frequency and hematuria.  Musculoskeletal: Positive for neck stiffness. Negative for gait problem.  Skin: Negative for color change, pallor and rash.  Neurological: Positive for numbness (tingling). Negative for dizziness, syncope, light-headedness and headaches.  Hematological: Does not bruise/bleed easily.  Psychiatric/Behavioral: Negative for behavioral problems and confusion.      Allergies  Review of patient's allergies indicates no known allergies.  Home Medications   Prior to Admission medications   Medication Sig Start Date End Date Taking? Authorizing Provider  aspirin 81 MG tablet Take 81 mg by mouth daily.   Yes Historical Provider, MD  atorvastatin (LIPITOR) 10 MG tablet Take 1 tablet (10 mg total) by mouth daily. 04/22/15  Yes Babs Sciara, MD  lisinopril (PRINIVIL,ZESTRIL) 5 MG tablet Take 1 tablet (5 mg total) by mouth daily. 04/22/15  Yes Scott A Luking, MD   BP 115/61 mmHg  Pulse 63  Temp(Src) 97.6 F (36.4  C) (Oral)  Resp 16  Ht 5\' 9"  (1.753 m)  Wt 178 lb (80.74 kg)  BMI 26.27 kg/m2  SpO2 98% Physical Exam  Constitutional: He is oriented to person, place, and time. He appears well-developed and well-nourished. No distress.  HENT:  Head: Normocephalic and atraumatic.  Eyes: Conjunctivae and EOM are normal. Pupils are equal, round, and reactive to light. No scleral icterus.  Neck: Normal range of motion. Neck supple. No tracheal deviation present.  No thyromegaly present.  Cardiovascular: Normal rate and regular rhythm.  Exam reveals no gallop and no friction rub.   No murmur heard. Pulmonary/Chest: Effort normal and breath sounds normal. No respiratory distress. He has no wheezes. He has no rales.  Abdominal: Soft. Bowel sounds are normal. He exhibits no distension. There is no tenderness. There is no rebound.  Musculoskeletal: Normal range of motion.  Neurological: He is alert and oriented to person, place, and time.  No pronator drift.   Skin: Skin is warm and dry. No rash noted.  Psychiatric: He has a normal mood and affect. His behavior is normal.  Nursing note and vitals reviewed.   ED Course  Procedures (including critical care time)  DIAGNOSTIC STUDIES: Oxygen Saturation is 100% on RA, normal by my interpretation.    COORDINATION OF CARE:  9:02 AM Will order labs as well as head and neck CT.  Patient acknowledges and agrees with plan.    Labs Review Labs Reviewed  BASIC METABOLIC PANEL - Abnormal; Notable for the following:    Glucose, Bld 115 (*)    Calcium 8.8 (*)    All other components within normal limits  CBC WITH DIFFERENTIAL/PLATELET  TROPONIN I    Imaging Review Ct Head Wo Contrast  06/18/2015   CLINICAL DATA:  Patient woke up with stiff neck, and numbness in LEFT arm and LEFT leg. Initial encounter.  EXAM: CT HEAD WITHOUT CONTRAST  CT ANGIOGRAPHY OF THE NECK  TECHNIQUE: Contiguous axial images were obtained from the base of the skull through the vertex without intravenous contrast. Multidetector CT imaging of the neck was performed using the standard protocol during bolus administration of intravenous contrast. Multiplanar CT image reconstructions and MIPs were obtained to evaluate the vascular anatomy. Carotid stenosis measurements (when applicable) are obtained utilizing NASCET criteria, using the distal internal carotid diameter as the denominator.  CONTRAST:  75mL OMNIPAQUE IOHEXOL 350 MG/ML SOLN   COMPARISON:  None.  FINDINGS: CT HEAD WITHOUT CONTRAST  No evidence for acute infarction, hemorrhage, mass lesion, hydrocephalus, or extra-axial fluid. No atrophy or white matter disease. Intact calvarium. No acute sinus or mastoid disease.  CT ANGIOGRAPHY OF THE NECK  Aortic arch: Standard branching. Imaged portion shows no evidence of aneurysm or dissection. No significant stenosis of the major arch vessel origins.  Right carotid system: Calcific plaque within the bulb and proximal RIGHT ICA, 1 cm above the origin. No evidence of dissection, stenosis (50% or greater) or occlusion.  Left carotid system: Calcified and noncalcified plaque within the proximal LEFT ICA. No evidence of dissection, stenosis (50% or greater) or occlusion.  Vertebral arteries: Codominant. There is eccentric calcified plaque at the origin of LEFT vertebral, possible 50-75% stenosis. Minor calcific plaque may be present at the origin of the RIGHT vertebral, not clearly resulting in luminal narrowing.  Nonvascular soft tissues: No neck masses or adenopathy. Cervical spondylosis with reversal of the normal cervical lordotic curve and disc space narrowing, worst at C5-6 and C6-7. No worrisome osseous lesions. No lung  apex lesion. No acute osseous findings. Visualized sinuses unremarkable.  IMPRESSION: No acute intracranial findings.  No extracranial carotid dissection.  Calcific ostial narrowing LEFT vertebral, possible 50-75% stenosis.   Electronically Signed   By: Elsie Stain M.D.   On: 06/18/2015 11:10   Ct Angio Neck W/cm &/or Wo/cm  06/18/2015   CLINICAL DATA:  Patient woke up with stiff neck, and numbness in LEFT arm and LEFT leg. Initial encounter.  EXAM: CT HEAD WITHOUT CONTRAST  CT ANGIOGRAPHY OF THE NECK  TECHNIQUE: Contiguous axial images were obtained from the base of the skull through the vertex without intravenous contrast. Multidetector CT imaging of the neck was performed using the standard protocol during bolus  administration of intravenous contrast. Multiplanar CT image reconstructions and MIPs were obtained to evaluate the vascular anatomy. Carotid stenosis measurements (when applicable) are obtained utilizing NASCET criteria, using the distal internal carotid diameter as the denominator.  CONTRAST:  75mL OMNIPAQUE IOHEXOL 350 MG/ML SOLN  COMPARISON:  None.  FINDINGS: CT HEAD WITHOUT CONTRAST  No evidence for acute infarction, hemorrhage, mass lesion, hydrocephalus, or extra-axial fluid. No atrophy or white matter disease. Intact calvarium. No acute sinus or mastoid disease.  CT ANGIOGRAPHY OF THE NECK  Aortic arch: Standard branching. Imaged portion shows no evidence of aneurysm or dissection. No significant stenosis of the major arch vessel origins.  Right carotid system: Calcific plaque within the bulb and proximal RIGHT ICA, 1 cm above the origin. No evidence of dissection, stenosis (50% or greater) or occlusion.  Left carotid system: Calcified and noncalcified plaque within the proximal LEFT ICA. No evidence of dissection, stenosis (50% or greater) or occlusion.  Vertebral arteries: Codominant. There is eccentric calcified plaque at the origin of LEFT vertebral, possible 50-75% stenosis. Minor calcific plaque may be present at the origin of the RIGHT vertebral, not clearly resulting in luminal narrowing.  Nonvascular soft tissues: No neck masses or adenopathy. Cervical spondylosis with reversal of the normal cervical lordotic curve and disc space narrowing, worst at C5-6 and C6-7. No worrisome osseous lesions. No lung apex lesion. No acute osseous findings. Visualized sinuses unremarkable.  IMPRESSION: No acute intracranial findings.  No extracranial carotid dissection.  Calcific ostial narrowing LEFT vertebral, possible 50-75% stenosis.   Electronically Signed   By: Elsie Stain M.D.   On: 06/18/2015 11:10   I have personally reviewed and evaluated these images and lab results as part of my medical  decision-making.   EKG Interpretation None      MDM   Final diagnoses:  Neck pain  Tingling in extremities    Unusual constellation of symptoms. A brief electrical shock sort of feeling in his left chest. Feeling of stiffness without pain in his neck. And then tingling in his left arm and leg which has since resolved. He was ambulatory. Full faculties, full speech without difficulties. CT image of his neck shows no dissection. CT of the head shows no stroke. I discussed with him about performing MRI and this was attempted. I have discussed with him ahead of time his claustrophobia and have written for Ativan. However he declined this.  Upon arrival CT he "freaked out". He became very anxious and upset. He states when they attempted to "put that mask thing over me" he could not tolerate it. I spoke with him on 2 additional occasions to try to encourage him to have some premedication. However he is adamant that he does not think he can do it and does not want to  try. It lasted to take full dose aspirin. . Return here with any recurrence of his symptoms. Patient declined observation stay here as he stated that he was supposed to go to the beach with his family today. We discussed the possibility of completed stroke and have asked him again if he has recurrence of any of the symptoms to present for reevaluation. He expresses understanding as did his wife was in the room during this discussion.   Rolland Porter, MD 06/18/15 [phone]

## 2015-06-18 NOTE — Discharge Instructions (Signed)
Return here with any recurrence of your symptoms.  Take a full aspirin every day.  Follow-up with your primary care physician

## 2015-06-18 NOTE — ED Notes (Signed)
EDP made aware of pt's refusal of MRI

## 2015-06-18 NOTE — ED Notes (Signed)
Pt refused ativan.

## 2015-06-24 ENCOUNTER — Encounter: Payer: Self-pay | Admitting: Family Medicine

## 2015-06-24 ENCOUNTER — Ambulatory Visit (INDEPENDENT_AMBULATORY_CARE_PROVIDER_SITE_OTHER): Payer: Medicare Other | Admitting: Family Medicine

## 2015-06-24 VITALS — BP 122/80 | Temp 98.5°F | Ht 69.0 in | Wt 184.1 lb

## 2015-06-24 DIAGNOSIS — F439 Reaction to severe stress, unspecified: Secondary | ICD-10-CM

## 2015-06-24 DIAGNOSIS — Z658 Other specified problems related to psychosocial circumstances: Secondary | ICD-10-CM

## 2015-06-24 DIAGNOSIS — R202 Paresthesia of skin: Secondary | ICD-10-CM

## 2015-06-24 DIAGNOSIS — R200 Anesthesia of skin: Secondary | ICD-10-CM

## 2015-06-24 MED ORDER — CITALOPRAM HYDROBROMIDE 10 MG PO TABS: 10.0000 mg | ORAL_TABLET | Freq: Every day | ORAL | Status: DC

## 2015-06-24 MED ORDER — VARDENAFIL HCL 10 MG PO TABS: 10.0000 mg | ORAL_TABLET | Freq: Every day | ORAL | Status: DC | PRN

## 2015-06-24 MED ORDER — CITALOPRAM HYDROBROMIDE 20 MG PO TABS: 20.0000 mg | ORAL_TABLET | Freq: Every day | ORAL | Status: DC

## 2015-06-24 NOTE — Progress Notes (Signed)
   Subjective:    Patient ID: Shane Young, male    DOB: 1945-10-21, 69 y.o.   MRN: [id number]  HPI Patient is here today for a ER follow up visit. Patient was seen at [facility] on 06/18/15 for tingling in extremities, neck pain and chest pain. Patient states that he thinks he has developed a bad case of anxiety and this has been happening over the past 2 years now.   Patient has no other concerns at this time.   long discussion held with the patient regarding his symptoms  extensive records from the ER reviewed   patient relates numbness and tingling in his left hand and left foot present over the past week and a half   patient does relate feeling somewhat stressed out over the past month findings himself on edge a lot quick to anger quick to being irritable.  He denies being depressed  he does have a lot of previous stress from serving in [place]   patient does not feel he is going to her anyone   patient relates he does take his cholesterol medicine blood pressure medicine on a regular basis   he does try to watch his diet stay physically active Denies any chest pressure tightness or pain with activity Review of Systems  Constitutional: Negative for activity change, appetite change and fatigue.  HENT: Negative for congestion.   Respiratory: Negative for cough.   Cardiovascular: Negative for chest pain.  Gastrointestinal: Negative for abdominal pain.  Endocrine: Negative for polydipsia and polyphagia.  Neurological: Negative for weakness.  Psychiatric/Behavioral: Negative for confusion.       Objective:   Physical Exam  Constitutional: He appears well-nourished. No distress.  Cardiovascular: Normal rate, regular rhythm and normal heart sounds.   No murmur heard. Pulmonary/Chest: Effort normal and breath sounds normal. No respiratory distress.  Musculoskeletal: He exhibits no edema.  Lymphadenopathy:    He has no cervical adenopathy.  Neurological: He is alert.  Psychiatric: His  behavior is normal.  Vitals reviewed.   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

## 2015-06-25 NOTE — Progress Notes (Signed)
Ambulatory Referral to neurology in epic.

## 2015-06-25 NOTE — Addendum Note (Signed)
Addended by: Jeralene Peters on: 06/25/2015 08:10 AM   Modules accepted: Orders

## 2015-07-23 ENCOUNTER — Encounter: Payer: Self-pay | Admitting: Family Medicine

## 2015-07-23 ENCOUNTER — Ambulatory Visit (INDEPENDENT_AMBULATORY_CARE_PROVIDER_SITE_OTHER): Payer: Medicare Other | Admitting: Family Medicine

## 2015-07-23 VITALS — BP 116/80 | Ht 69.0 in | Wt 185.0 lb

## 2015-07-23 DIAGNOSIS — I10 Essential (primary) hypertension: Secondary | ICD-10-CM

## 2015-07-23 NOTE — Progress Notes (Signed)
   Subjective:    Patient ID: Shane Young, male    DOB: 09-12-1946, 69 y.o.   MRN: [id number]  HPIFollow up on starting celexa 10mg  once daily. He states this is helping a little bit with his stress levels not greatly.  Gave blood Wednesday am and did not drink fluids after. Was outside working and got swimmy headed. Had to get someone to take him home. Felt fine since then.   Patient states that he is working outside very hot weather felt dizzy and had laid down drink much liquids finally got better patient concluded that he probably got dehydrated which I agree with patient has history of hypertension has been taking his medicines. Declines flu vaccine.  Review of Systems  Constitutional: Negative for activity change, appetite change and fatigue.  HENT: Negative for congestion.   Respiratory: Negative for cough.   Cardiovascular: Negative for chest pain.  Gastrointestinal: Negative for abdominal pain.  Endocrine: Negative for polydipsia and polyphagia.  Neurological: Negative for weakness.  Psychiatric/Behavioral: Negative for confusion.       Objective:   Physical Exam  Constitutional: He appears well-nourished. No distress.  Cardiovascular: Normal rate, regular rhythm and normal heart sounds.   No murmur heard. Pulmonary/Chest: Effort normal and breath sounds normal. No respiratory distress.  Musculoskeletal: He exhibits no edema.  Lymphadenopathy:    He has no cervical adenopathy.  Neurological: He is alert.  Psychiatric: His behavior is normal.  Vitals reviewed.   Patient denies depression.      Assessment & Plan:  Blood pressure overall doing well continue current medications  Social phobias Celexa seems to be helping a little bit but he states a lot of what he is going through is related to [place] he just really does not want to travel or be around large crowds and he really feels that this is the way it is going to stay he does not want to do  anything different  Follow-up in the springtime follow-up sooner problems

## 2015-07-23 NOTE — Patient Instructions (Signed)
DASH Eating Plan  DASH stands for "Dietary Approaches to Stop Hypertension." The DASH eating plan is a healthy eating plan that has been shown to reduce high blood pressure (hypertension). Additional health benefits may include reducing the risk of type 2 diabetes mellitus, heart disease, and stroke. The DASH eating plan may also help with weight loss.  WHAT DO I NEED TO KNOW ABOUT THE DASH EATING PLAN?  For the DASH eating plan, you will follow these general guidelines:  · Choose foods with a percent daily value for sodium of less than 5% (as listed on the food label).  · Use salt-free seasonings or herbs instead of table salt or sea salt.  · Check with your health care provider or pharmacist before using salt substitutes.  · Eat lower-sodium products, often labeled as "lower sodium" or "no salt added."  · Eat fresh foods.  · Eat more vegetables, fruits, and low-fat dairy products.  · Choose whole grains. Look for the word "whole" as the first word in the ingredient list.  · Choose fish and skinless chicken or turkey more often than red meat. Limit fish, poultry, and meat to 6 oz (170 g) each day.  · Limit sweets, desserts, sugars, and sugary drinks.  · Choose heart-healthy fats.  · Limit cheese to 1 oz (28 g) per day.  · Eat more home-cooked food and less restaurant, buffet, and fast food.  · Limit fried foods.  · Cook foods using methods other than frying.  · Limit canned vegetables. If you do use them, rinse them well to decrease the sodium.  · When eating at a restaurant, ask that your food be prepared with less salt, or no salt if possible.  WHAT FOODS CAN I EAT?  Seek help from a dietitian for individual calorie needs.  Grains  Whole grain or whole wheat bread. Brown rice. Whole grain or whole wheat pasta. Quinoa, bulgur, and whole grain cereals. Low-sodium cereals. Corn or whole wheat flour tortillas. Whole grain cornbread. Whole grain crackers. Low-sodium crackers.  Vegetables  Fresh or frozen vegetables  (raw, steamed, roasted, or grilled). Low-sodium or reduced-sodium tomato and vegetable juices. Low-sodium or reduced-sodium tomato sauce and paste. Low-sodium or reduced-sodium canned vegetables.   Fruits  All fresh, canned (in natural juice), or frozen fruits.  Meat and Other Protein Products  Ground beef (85% or leaner), grass-fed beef, or beef trimmed of fat. Skinless chicken or turkey. Ground chicken or turkey. Pork trimmed of fat. All fish and seafood. Eggs. Dried beans, peas, or lentils. Unsalted nuts and seeds. Unsalted canned beans.  Dairy  Low-fat dairy products, such as skim or 1% milk, 2% or reduced-fat cheeses, low-fat ricotta or cottage cheese, or plain low-fat yogurt. Low-sodium or reduced-sodium cheeses.  Fats and Oils  Tub margarines without trans fats. Light or reduced-fat mayonnaise and salad dressings (reduced sodium). Avocado. Safflower, olive, or canola oils. Natural peanut or almond butter.  Other  Unsalted popcorn and pretzels.  The items listed above may not be a complete list of recommended foods or beverages. Contact your dietitian for more options.  WHAT FOODS ARE NOT RECOMMENDED?  Grains  White bread. White pasta. White rice. Refined cornbread. Bagels and croissants. Crackers that contain trans fat.  Vegetables  Creamed or fried vegetables. Vegetables in a cheese sauce. Regular canned vegetables. Regular canned tomato sauce and paste. Regular tomato and vegetable juices.  Fruits  Dried fruits. Canned fruit in light or heavy syrup. Fruit juice.  Meat and Other Protein   Products  Fatty cuts of meat. Ribs, chicken wings, bacon, sausage, bologna, salami, chitterlings, fatback, hot dogs, bratwurst, and packaged luncheon meats. Salted nuts and seeds. Canned beans with salt.  Dairy  Whole or 2% milk, cream, half-and-half, and cream cheese. Whole-fat or sweetened yogurt. Full-fat cheeses or blue cheese. Nondairy creamers and whipped toppings. Processed cheese, cheese spreads, or cheese  curds.  Condiments  Onion and garlic salt, seasoned salt, table salt, and sea salt. Canned and packaged gravies. Worcestershire sauce. Tartar sauce. Barbecue sauce. Teriyaki sauce. Soy sauce, including reduced sodium. Steak sauce. Fish sauce. Oyster sauce. Cocktail sauce. Horseradish. Ketchup and mustard. Meat flavorings and tenderizers. Bouillon cubes. Hot sauce. Tabasco sauce. Marinades. Taco seasonings. Relishes.  Fats and Oils  Butter, stick margarine, lard, shortening, ghee, and bacon fat. Coconut, palm kernel, or palm oils. Regular salad dressings.  Other  Pickles and olives. Salted popcorn and pretzels.  The items listed above may not be a complete list of foods and beverages to avoid. Contact your dietitian for more information.  WHERE CAN I FIND MORE INFORMATION?  National Heart, Lung, and Blood Institute: www.nhlbi.nih.gov/health/health-topics/topics/dash/     This information is not intended to replace advice given to you by your health care provider. Make sure you discuss any questions you have with your health care provider.     Document Released: 09/07/2011 Document Revised: 10/09/2014 Document Reviewed: 07/23/2013  Elsevier Interactive Patient Education ©2016 Elsevier Inc.

## 2015-10-06 ENCOUNTER — Other Ambulatory Visit: Payer: Self-pay | Admitting: Family Medicine

## 2015-10-22 ENCOUNTER — Ambulatory Visit: Payer: Medicare Other | Admitting: Family Medicine

## 2016-01-21 ENCOUNTER — Encounter: Payer: Self-pay | Admitting: Family Medicine

## 2016-01-21 ENCOUNTER — Ambulatory Visit (INDEPENDENT_AMBULATORY_CARE_PROVIDER_SITE_OTHER): Payer: Medicare Other | Admitting: Family Medicine

## 2016-01-21 VITALS — BP 120/74 | Ht 69.0 in | Wt 183.2 lb

## 2016-01-21 DIAGNOSIS — E785 Hyperlipidemia, unspecified: Secondary | ICD-10-CM

## 2016-01-21 DIAGNOSIS — I10 Essential (primary) hypertension: Secondary | ICD-10-CM | POA: Diagnosis not present

## 2016-01-21 DIAGNOSIS — Z125 Encounter for screening for malignant neoplasm of prostate: Secondary | ICD-10-CM

## 2016-01-21 MED ORDER — CITALOPRAM HYDROBROMIDE 10 MG PO TABS: 10.0000 mg | ORAL_TABLET | Freq: Every day | ORAL | Status: DC

## 2016-01-21 MED ORDER — ATORVASTATIN CALCIUM 10 MG PO TABS: 10.0000 mg | ORAL_TABLET | Freq: Every day | ORAL | Status: DC

## 2016-01-21 MED ORDER — CITALOPRAM HYDROBROMIDE 10 MG PO TABS
10.0000 mg | ORAL_TABLET | Freq: Every day | ORAL | Status: DC
Start: 2016-01-21 — End: 2016-05-19

## 2016-01-21 MED ORDER — LISINOPRIL 5 MG PO TABS: 5.0000 mg | ORAL_TABLET | Freq: Every day | ORAL | Status: DC

## 2016-01-21 NOTE — Progress Notes (Signed)
   Subjective:    Patient ID: Shane LusterRoger L Driver, male    DOB: [birth year], 70 y.o.   MRN: [id number]  Hypertension This is a chronic problem. The current episode started more than 1 year ago. Pertinent negatives include no chest pain. Risk factors for coronary artery disease include dyslipidemia and male gender. Treatments tried: lisinopril. There are no compliance problems.    Patient denies any chest tightness pressure pain shortness breath nausea vomiting diarrhea states he takes his medicine tries to stay physically active   Review of Systems  Constitutional: Negative for activity change, appetite change and fatigue.  HENT: Negative for congestion.   Respiratory: Negative for cough.   Cardiovascular: Negative for chest pain.  Gastrointestinal: Negative for abdominal pain.  Endocrine: Negative for polydipsia and polyphagia.  Neurological: Negative for weakness.  Psychiatric/Behavioral: Negative for confusion.       Objective:   Physical Exam  Constitutional: He appears well-nourished. No distress.  Cardiovascular: Normal rate, regular rhythm and normal heart sounds.   No murmur heard. Pulmonary/Chest: Effort normal and breath sounds normal. No respiratory distress.  Musculoskeletal: He exhibits no edema.  Lymphadenopathy:    He has no cervical adenopathy.  Neurological: He is alert.  Psychiatric: His behavior is normal.  Vitals reviewed.         Assessment & Plan:  Hypertension very good control continue current measures Hyperlipidemia patient felt that the medication was causing him significant side effects so he stopped taking it did get low but better but he is going to start back on the medicines and therefore I told him take 1 tablet 3 days per week for the first couple weeks then gradually increase in let us know if he has any problems hopefully he'll be able to tolerate one every day Continue 81 mg aspirin Continue Celexa Lab work in July wellness in early  August

## 2016-01-21 NOTE — Patient Instructions (Signed)
Do labs in late July- wellness in August

## 2016-04-21 ENCOUNTER — Telehealth: Payer: Self-pay | Admitting: Family Medicine

## 2016-04-21 NOTE — Telephone Encounter (Signed)
Left message return call 04/21/16 

## 2016-04-21 NOTE — Telephone Encounter (Signed)
bw orders for appt 8/18  Last labs 01/21/16 Lip, Hep, BMP, PSA   A1C last done 04/16/15

## 2016-04-21 NOTE — Telephone Encounter (Signed)
He has lab work orders in the system from April that he can go get done-does not require papers. If necessary give him papers.

## 2016-04-21 NOTE — Telephone Encounter (Signed)
Spoke with patient and informed him lab orders from April are still in the system. May go to the lab a few days prior to appointment and have labs drawn. Patient verbalized understanding.

## 2016-05-16 LAB — PSA: Prostate Specific Ag, Serum: 1.6 ng/mL (ref 0.0–4.0)

## 2016-05-16 LAB — BASIC METABOLIC PANEL
BUN/Creatinine Ratio: 13 (ref 10–24)
BUN: 14 mg/dL (ref 8–27)
CALCIUM: 9.3 mg/dL (ref 8.6–10.2)
CHLORIDE: 104 mmol/L (ref 96–106)
CO2: 22 mmol/L (ref 18–29)
Creatinine, Ser: 1.04 mg/dL (ref 0.76–1.27)
GFR, EST AFRICAN AMERICAN: 84 mL/min/{1.73_m2} (ref >59)
GFR, EST NON AFRICAN AMERICAN: 73 mL/min/{1.73_m2} (ref >59)
Glucose: 98 mg/dL (ref 65–99)
Potassium: 4.7 mmol/L (ref 3.5–5.2)
Sodium: 140 mmol/L (ref 134–144)

## 2016-05-16 LAB — LIPID PANEL
CHOL/HDL RATIO: 2.9 ratio (ref 0.0–5.0)
Cholesterol, Total: 130 mg/dL (ref 100–199)
HDL: 45 mg/dL (ref >39)
LDL CALC: 66 mg/dL (ref 0–99)
Triglycerides: 96 mg/dL (ref 0–149)
VLDL CHOLESTEROL CAL: 19 mg/dL (ref 5–40)

## 2016-05-16 LAB — HEPATIC FUNCTION PANEL
ALBUMIN: 4.2 g/dL (ref 3.6–4.8)
ALT: 19 IU/L (ref 0–44)
AST: 15 IU/L (ref 0–40)
Alkaline Phosphatase: 58 IU/L (ref 39–117)
BILIRUBIN TOTAL: 0.4 mg/dL (ref 0.0–1.2)
Bilirubin, Direct: 0.12 mg/dL (ref 0.00–0.40)
TOTAL PROTEIN: 6.5 g/dL (ref 6.0–8.5)

## 2016-05-18 ENCOUNTER — Other Ambulatory Visit: Payer: Self-pay | Admitting: Family Medicine

## 2016-05-19 ENCOUNTER — Ambulatory Visit (INDEPENDENT_AMBULATORY_CARE_PROVIDER_SITE_OTHER): Payer: Medicare Other | Admitting: Family Medicine

## 2016-05-19 ENCOUNTER — Encounter: Payer: Self-pay | Admitting: Family Medicine

## 2016-05-19 VITALS — BP 132/86 | Ht 68.75 in | Wt 181.0 lb

## 2016-05-19 DIAGNOSIS — I10 Essential (primary) hypertension: Secondary | ICD-10-CM | POA: Diagnosis not present

## 2016-05-19 DIAGNOSIS — E785 Hyperlipidemia, unspecified: Secondary | ICD-10-CM | POA: Diagnosis not present

## 2016-05-19 DIAGNOSIS — Z0000 Encounter for general adult medical examination without abnormal findings: Secondary | ICD-10-CM | POA: Diagnosis not present

## 2016-05-19 MED ORDER — ATORVASTATIN CALCIUM 10 MG PO TABS: 10.0000 mg | ORAL_TABLET | Freq: Every day | ORAL | 1 refills | Status: DC

## 2016-05-19 MED ORDER — LISINOPRIL 5 MG PO TABS: 5.0000 mg | ORAL_TABLET | Freq: Every day | ORAL | 1 refills | Status: DC

## 2016-05-19 NOTE — Progress Notes (Signed)
Subjective:    Patient ID: Shane Young, male    DOB: 1946-05-18, 70 y.o.   MRN: [id number]  HPI AWV- Annual Wellness Visit  The patient was seen for their annual wellness visit. The patient's past medical history, surgical history, and family history were reviewed. Pertinent vaccines were reviewed ( tetanus, pneumonia, shingles, flu) The patient's medication list was reviewed and updated.  The height and weight were entered. The patient's current B27MI is: 4427  Cognitive screening was completed. Outcome of Mini - Cog: pass  Falls within the past 6 months:none  Current tobacco usage: chew tobacco (All patients who use tobacco were given written and verbal information on quitting)  Recent listing of emergency department/hospitalizations over the past year were reviewed.  current specialist the patient sees on a regular basis: none   Medicare annual wellness visit patient questionnaire was reviewed.  A written screening schedule for the patient for the next 5-10 years was given. Appropriate discussion of followup regarding next visit was discussed.  Patient states his moods overall doing well he did not see that Celexa helps to many He is not taking Celexa He does take his blood pressure medicine and cholesterol medicine he does try to watch diet does try to minimize salt states his overall energy level is doing good denies any chest tightness pressure pain shortness breath up-to-date on colonoscopy denies any rectal bleeding hematuria Lab work reviewed in detail with the patient including cholesterol PSA metabolic 7 and liver profile  Recheck patient in 6 months     Review of Systems  Constitutional: Negative for activity change, appetite change and fever.  HENT: Negative for congestion and rhinorrhea.   Eyes: Negative for discharge.  Respiratory: Negative for cough and wheezing.   Cardiovascular: Negative for chest pain.  Gastrointestinal: Negative for abdominal  pain, blood in stool and vomiting.  Genitourinary: Negative for difficulty urinating and frequency.  Musculoskeletal: Negative for neck pain.  Skin: Negative for rash.  Allergic/Immunologic: Negative for environmental allergies and food allergies.  Neurological: Negative for weakness and headaches.  Psychiatric/Behavioral: Negative for agitation.       Objective:   Physical Exam  Constitutional: He appears well-developed and well-nourished.  HENT:  Head: Normocephalic and atraumatic.  Right Ear: External ear normal.  Left Ear: External ear normal.  Nose: Nose normal.  Mouth/Throat: Oropharynx is clear and moist.  Eyes: EOM are normal. Pupils are equal, round, and reactive to light.  Neck: Normal range of motion. Neck supple. No thyromegaly present.  Cardiovascular: Normal rate, regular rhythm and normal heart sounds.   No murmur heard. Pulmonary/Chest: Effort normal and breath sounds normal. No respiratory distress. He has no wheezes.  Abdominal: Soft. Bowel sounds are normal. He exhibits no distension and no mass. There is no tenderness.  Genitourinary: Penis normal.  Musculoskeletal: Normal range of motion. He exhibits no edema.  Lymphadenopathy:    He has no cervical adenopathy.  Neurological: He is alert. He exhibits normal muscle tone.  Skin: Skin is warm and dry. No erythema.  Psychiatric: He has a normal mood and affect. His behavior is normal. Judgment normal.    Prostate exam is normal      Assessment & Plan:  Adult wellness-complete.wellness physical was conducted today. Importance of diet and exercise were discussed in detail. In addition to this a discussion regarding safety was also covered. We also reviewed over immunizations and gave recommendations regarding current immunization needed for age. In addition to this additional areas were also  touched on including: Preventative health exams needed: Colonoscopy 2025  Patient was advised yearly wellness  exam   The patient was seen today as part of an evaluation regarding hyperlipidemia. Recent lab work has been reviewed with the patient as well as the goals for good cholesterol care. In addition to this medications have been discussed the importance of compliance with diet and medications discussed as well. Patient has been informed of potential side effects of medications in the importance to notify us should any problems occur. Finally the patient is aware that poor control of cholesterol, noncompliance can dramatically increase her risk of heart attack strokes and premature death. The patient will keep regular office visits and the patient does agreed to periodic lab work.  HTN- Patient was seen today as part of a visit regarding hypertension. The importance of healthy diet and regular physical activity was discussed. The importance of compliance with medications discussed. Ideal goal is to keep blood pressure low elevated levels certainly below 140/90 when possible. The patient was counseled that keeping blood pressure under control lessen his risk of heart attack, stroke, kidney failure, and early death. The importance of regular follow-ups was discussed with the patient. Low-salt diet such as DASH recommended. Regular physical activity was recommended as well. Patient was advised to keep regular follow-ups. Recheck 6 months

## 2016-10-05 ENCOUNTER — Other Ambulatory Visit: Payer: Self-pay | Admitting: Family Medicine

## 2016-11-07 ENCOUNTER — Telehealth: Payer: Self-pay | Admitting: Family Medicine

## 2016-11-07 DIAGNOSIS — E785 Hyperlipidemia, unspecified: Secondary | ICD-10-CM

## 2016-11-07 DIAGNOSIS — I10 Essential (primary) hypertension: Secondary | ICD-10-CM

## 2016-11-07 NOTE — Telephone Encounter (Signed)
Requesting order for blood work for appointment on 11/20/16 with Dr. Lorin PicketScott.

## 2016-11-08 NOTE — Telephone Encounter (Signed)
Lipid, liver, metabolic 7 

## 2016-11-09 NOTE — Telephone Encounter (Signed)
Spoke with patient and informed him per Dr.Scott Luking- Lab work for upcoming appointment has been ordered. Patient verbalized understanding.

## 2016-11-09 NOTE — Telephone Encounter (Signed)
Left message return call 11/09/16 (labs ordered)

## 2016-11-17 LAB — LIPID PANEL
CHOLESTEROL TOTAL: 188 mg/dL (ref 100–199)
Chol/HDL Ratio: 4.2 ratio units (ref 0.0–5.0)
HDL: 45 mg/dL (ref >39)
LDL CALC: 122 mg/dL — AB (ref 0–99)
TRIGLYCERIDES: 104 mg/dL (ref 0–149)
VLDL Cholesterol Cal: 21 mg/dL (ref 5–40)

## 2016-11-17 LAB — HEPATIC FUNCTION PANEL
ALT: 17 IU/L (ref 0–44)
AST: 15 IU/L (ref 0–40)
Albumin: 4.3 g/dL (ref 3.6–4.8)
Alkaline Phosphatase: 56 IU/L (ref 39–117)
BILIRUBIN TOTAL: 0.3 mg/dL (ref 0.0–1.2)
BILIRUBIN, DIRECT: 0.05 mg/dL (ref 0.00–0.40)
Total Protein: 6.7 g/dL (ref 6.0–8.5)

## 2016-11-17 LAB — BASIC METABOLIC PANEL
BUN / CREAT RATIO: 13 (ref 10–24)
BUN: 13 mg/dL (ref 8–27)
CALCIUM: 9.2 mg/dL (ref 8.6–10.2)
CHLORIDE: 102 mmol/L (ref 96–106)
CO2: 24 mmol/L (ref 18–29)
Creatinine, Ser: 0.99 mg/dL (ref 0.76–1.27)
GFR calc non Af Amer: 77 mL/min/{1.73_m2} (ref >59)
GFR, EST AFRICAN AMERICAN: 89 mL/min/{1.73_m2} (ref >59)
Glucose: 100 mg/dL — ABNORMAL HIGH (ref 65–99)
POTASSIUM: 4.6 mmol/L (ref 3.5–5.2)
Sodium: 142 mmol/L (ref 134–144)

## 2016-11-20 ENCOUNTER — Encounter: Payer: Self-pay | Admitting: Family Medicine

## 2016-11-20 ENCOUNTER — Ambulatory Visit (INDEPENDENT_AMBULATORY_CARE_PROVIDER_SITE_OTHER): Payer: Medicare Other | Admitting: Family Medicine

## 2016-11-20 VITALS — BP 128/80 | Ht 68.25 in | Wt 186.6 lb

## 2016-11-20 DIAGNOSIS — E784 Other hyperlipidemia: Secondary | ICD-10-CM | POA: Diagnosis not present

## 2016-11-20 DIAGNOSIS — E7849 Other hyperlipidemia: Secondary | ICD-10-CM

## 2016-11-20 DIAGNOSIS — I10 Essential (primary) hypertension: Secondary | ICD-10-CM | POA: Diagnosis not present

## 2016-11-20 MED ORDER — ATORVASTATIN CALCIUM 20 MG PO TABS: 20.0000 mg | ORAL_TABLET | Freq: Every day | ORAL | 1 refills | Status: DC

## 2016-11-20 MED ORDER — LISINOPRIL 5 MG PO TABS: 5.0000 mg | ORAL_TABLET | Freq: Every day | ORAL | 1 refills | Status: DC

## 2016-11-20 NOTE — Progress Notes (Signed)
   Subjective:    Patient ID: Shane Young, male    DOB: 11/07/1945, 71 y.o.   MRN: [id number]  Hypertension  This is a chronic problem. The current episode started more than 1 year ago. Pertinent negatives include no chest pain, headaches or shortness of breath. Risk factors for coronary artery disease include dyslipidemia. Treatments tried: lisinopril. There are no compliance problems.    Patient does take his cholesterol medicine regular basis denies a problem with it takes his blood pressure medicine as well as aspirin he does try to eat relatively healthy stays physically active denies any hematuria rectal bleeding   Review of Systems  Constitutional: Negative for activity change, fatigue and fever.  Respiratory: Negative for cough and shortness of breath.   Cardiovascular: Negative for chest pain and leg swelling.  Neurological: Negative for headaches.       Objective:   Physical Exam  Constitutional: He appears well-nourished. No distress.  Cardiovascular: Normal rate, regular rhythm and normal heart sounds.   No murmur heard. Pulmonary/Chest: Effort normal and breath sounds normal. No respiratory distress.  Musculoskeletal: He exhibits no edema.  Lymphadenopathy:    He has no cervical adenopathy.  Neurological: He is alert.  Psychiatric: His behavior is normal.  Vitals reviewed.         Assessment & Plan:  Hypertension good control continue current measures watch diet  Hyperlipidemia previous labs reviewed continue current measures watch diet-increase medication to 20 mg daily to get the LDL below 100 if he feels he is having problems with the medicine he will let us know  Follow-up 6 months for comprehensive check and wellness

## 2016-12-02 ENCOUNTER — Other Ambulatory Visit: Payer: Self-pay | Admitting: Family Medicine

## 2017-03-06 ENCOUNTER — Other Ambulatory Visit: Payer: Self-pay | Admitting: Family Medicine

## 2017-04-03 ENCOUNTER — Other Ambulatory Visit: Payer: Self-pay | Admitting: Family Medicine

## 2017-05-14 ENCOUNTER — Ambulatory Visit (INDEPENDENT_AMBULATORY_CARE_PROVIDER_SITE_OTHER): Payer: Medicare Other | Admitting: Family Medicine

## 2017-05-14 ENCOUNTER — Encounter: Payer: Self-pay | Admitting: Family Medicine

## 2017-05-14 VITALS — BP 130/86 | Temp 98.0°F | Ht 68.25 in | Wt 184.0 lb

## 2017-05-14 DIAGNOSIS — M5432 Sciatica, left side: Secondary | ICD-10-CM | POA: Diagnosis not present

## 2017-05-14 DIAGNOSIS — E784 Other hyperlipidemia: Secondary | ICD-10-CM

## 2017-05-14 DIAGNOSIS — Z79899 Other long term (current) drug therapy: Secondary | ICD-10-CM | POA: Diagnosis not present

## 2017-05-14 DIAGNOSIS — E7849 Other hyperlipidemia: Secondary | ICD-10-CM

## 2017-05-14 DIAGNOSIS — M5431 Sciatica, right side: Secondary | ICD-10-CM

## 2017-05-14 DIAGNOSIS — Z125 Encounter for screening for malignant neoplasm of prostate: Secondary | ICD-10-CM | POA: Diagnosis not present

## 2017-05-14 DIAGNOSIS — I10 Essential (primary) hypertension: Secondary | ICD-10-CM | POA: Diagnosis not present

## 2017-05-14 MED ORDER — CHLORZOXAZONE 500 MG PO TABS: 500.0000 mg | ORAL_TABLET | Freq: Three times a day (TID) | ORAL | 0 refills | Status: DC | PRN

## 2017-05-14 MED ORDER — PREDNISONE 20 MG PO TABS: ORAL_TABLET | ORAL | 0 refills | Status: DC

## 2017-05-14 NOTE — Patient Instructions (Signed)

## 2017-05-14 NOTE — Progress Notes (Signed)
   Subjective:    Patient ID: Shane Young, male    DOB: [birth year], 71 y.o.   MRN: [id number]  Back Pain  This is a new problem. The current episode started in the past 7 days. The problem occurs constantly. The problem has been gradually worsening since onset. The pain is present in the lumbar spine. The quality of the pain is described as aching. The pain is at a severity of 8/10. The pain is moderate. The symptoms are aggravated by bending and standing. Pertinent negatives include no abdominal pain, bladder incontinence, bowel incontinence or chest pain. He has tried analgesics for the symptoms. The treatment provided no relief.   Patient states he is stiff and has lower back pain that radiates down both of his legs, left leg pain is worse than the right. No other concerns.  Review of Systems  Cardiovascular: Negative for chest pain.  Gastrointestinal: Negative for abdominal pain and bowel incontinence.  Genitourinary: Negative for bladder incontinence.  Musculoskeletal: Positive for back pain.       Objective:   Physical Exam  Constitutional: He appears well-nourished.  Cardiovascular: Normal rate, regular rhythm and normal heart sounds.   No murmur heard. Pulmonary/Chest: Effort normal and breath sounds normal.  Musculoskeletal: He exhibits tenderness. He exhibits no edema.  Lymphadenopathy:    He has no cervical adenopathy.  Neurological: He is alert.  Psychiatric: His behavior is normal.  Vitals reviewed.   Exercises were shown regarding the back warning signs discussed      Assessment & Plan:  Significant bilateral sciatica with lumbar involvement There is no sign of any type of weakness going on here. Patient had a significant pain and discomfort therefore we will go with prednisone over the next several days along with muscle relaxer. He was instructed use muscle relaxer only when he is at home not when he is working or driving. The patient has a wellness check up  next week we can see how he is doing  Labs were ordered regarding his other health issues

## 2017-05-17 LAB — BASIC METABOLIC PANEL
BUN/Creatinine Ratio: 20 (ref 10–24)
BUN: 18 mg/dL (ref 8–27)
CALCIUM: 9.2 mg/dL (ref 8.6–10.2)
CHLORIDE: 106 mmol/L (ref 96–106)
CO2: 18 mmol/L — AB (ref 20–29)
Creatinine, Ser: 0.92 mg/dL (ref 0.76–1.27)
GFR calc Af Amer: 97 mL/min/{1.73_m2} (ref >59)
GFR, EST NON AFRICAN AMERICAN: 84 mL/min/{1.73_m2} (ref >59)
Glucose: 95 mg/dL (ref 65–99)
POTASSIUM: 4 mmol/L (ref 3.5–5.2)
SODIUM: 144 mmol/L (ref 134–144)

## 2017-05-17 LAB — LIPID PANEL
CHOLESTEROL TOTAL: 122 mg/dL (ref 100–199)
Chol/HDL Ratio: 2.8 ratio (ref 0.0–5.0)
HDL: 44 mg/dL (ref >39)
LDL CALC: 68 mg/dL (ref 0–99)
Triglycerides: 50 mg/dL (ref 0–149)
VLDL Cholesterol Cal: 10 mg/dL (ref 5–40)

## 2017-05-17 LAB — HEPATIC FUNCTION PANEL
ALBUMIN: 4.5 g/dL (ref 3.5–4.8)
ALK PHOS: 53 IU/L (ref 39–117)
ALT: 15 IU/L (ref 0–44)
AST: 18 IU/L (ref 0–40)
Bilirubin Total: 0.3 mg/dL (ref 0.0–1.2)
Bilirubin, Direct: 0.09 mg/dL (ref 0.00–0.40)
Total Protein: 6.8 g/dL (ref 6.0–8.5)

## 2017-05-17 LAB — PSA: PROSTATE SPECIFIC AG, SERUM: 2 ng/mL (ref 0.0–4.0)

## 2017-05-21 ENCOUNTER — Telehealth: Payer: Self-pay | Admitting: *Deleted

## 2017-05-21 ENCOUNTER — Encounter: Payer: Self-pay | Admitting: Family Medicine

## 2017-05-21 ENCOUNTER — Ambulatory Visit (INDEPENDENT_AMBULATORY_CARE_PROVIDER_SITE_OTHER): Payer: Medicare Other | Admitting: Family Medicine

## 2017-05-21 VITALS — BP 140/88 | Ht 68.25 in | Wt 186.0 lb

## 2017-05-21 DIAGNOSIS — M5431 Sciatica, right side: Secondary | ICD-10-CM

## 2017-05-21 DIAGNOSIS — E784 Other hyperlipidemia: Secondary | ICD-10-CM

## 2017-05-21 DIAGNOSIS — M5432 Sciatica, left side: Secondary | ICD-10-CM | POA: Diagnosis not present

## 2017-05-21 DIAGNOSIS — E7849 Other hyperlipidemia: Secondary | ICD-10-CM

## 2017-05-21 DIAGNOSIS — E782 Mixed hyperlipidemia: Secondary | ICD-10-CM

## 2017-05-21 DIAGNOSIS — I10 Essential (primary) hypertension: Secondary | ICD-10-CM | POA: Diagnosis not present

## 2017-05-21 DIAGNOSIS — Z0000 Encounter for general adult medical examination without abnormal findings: Secondary | ICD-10-CM | POA: Diagnosis not present

## 2017-05-21 MED ORDER — DICLOFENAC SODIUM 75 MG PO TBEC: 75.0000 mg | DELAYED_RELEASE_TABLET | Freq: Two times a day (BID) | ORAL | 0 refills | Status: DC

## 2017-05-21 NOTE — Telephone Encounter (Signed)
The patient is aware of this. Diclofenac was prescribed thank you

## 2017-05-21 NOTE — Progress Notes (Addendum)
Subjective:    Patient ID: Shane Young, male    DOB: 1945-10-04, 71 y.o.   MRN: [id number]  Hypertension  This is a chronic problem. The current episode started more than 1 year ago. The problem is unchanged. Pertinent negatives include no chest pain, headaches or neck pain. Improvement on treatment: lisinopril 5 mg QD.  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  The patient comes in today for a wellness visit.    A review of their health history was completed.  A review of medications was also completed.  Any needed refills; Medications updated  Eating habits: Definitely tries to eat healthy on a regular basis  Falls/  MVA accidents in past few months: No falls or injuries denies being depressed  Regular exercise: Keeps physically active  Specialist pt sees on regular basis: Does not see any specialists  Preventative health issues were discussed.   Additional concerns: Having significant back pain and discomfort-Patient relates severe low back pain radiates down both legs numbness tingling or weakness difficult time getting around have any use a cane he is worse in what he was a week ago. He has not had this problem before.   Patient does try to eat healthy try to minimize salt takes his medicine as directed. Denies, patient with blood pressure or cholesterol medicine Review of Systems  Constitutional: Negative for activity change, appetite change and fever.  HENT: Negative for congestion and rhinorrhea.   Eyes: Negative for discharge.  Respiratory: Negative for cough and wheezing.   Cardiovascular: Negative for chest pain.  Gastrointestinal: Negative for abdominal pain, blood in stool and vomiting.  Genitourinary: Negative for difficulty urinating and frequency.  Musculoskeletal: Positive for arthralgias and back pain. Negative for neck pain.  Skin: Negative for rash.  Allergic/Immunologic: Negative for environmental allergies and food allergies.    Neurological: Negative for weakness and headaches.  Psychiatric/Behavioral: Negative for agitation.       Objective:   Physical Exam  Constitutional: He appears well-developed and well-nourished.  HENT:  Head: Normocephalic and atraumatic.  Right Ear: External ear normal.  Left Ear: External ear normal.  Nose: Nose normal.  Mouth/Throat: Oropharynx is clear and moist.  Eyes: Pupils are equal, round, and reactive to light. EOM are normal.  Neck: Normal range of motion. Neck supple. No thyromegaly present.  Cardiovascular: Normal rate, regular rhythm and normal heart sounds.   No murmur heard. Pulmonary/Chest: Effort normal and breath sounds normal. No respiratory distress. He has no wheezes.  Abdominal: Soft. Bowel sounds are normal. He exhibits no distension and no mass. There is no tenderness.  Genitourinary: Prostate normal and penis normal.  Musculoskeletal: Normal range of motion. He exhibits no edema.  Lymphadenopathy:    He has no cervical adenopathy.  Neurological: He is alert. He exhibits normal muscle tone.  Skin: Skin is warm and dry. No erythema.  Psychiatric: He has a normal mood and affect. His behavior is normal. Judgment normal.  On physical exam he has weakness in the right foot has difficult time raising his foot up in raising the great toe up this is different than what it was a week ago. Negative straight leg raise. Subjective pain and discomfort into both legs, Reflexes are good     Assessment & Plan:  Adult wellness-complete.wellness physical was conducted today. Importance of diet and exercise were discussed in detail. In addition to this a discussion regarding safety was also covered. We also reviewed over immunizations and gave recommendations regarding  current immunization needed for age. In addition to this additional areas were also touched on including: Preventative health exams needed: Colonoscopy up-to-date  Patient was advised yearly wellness  exam  Hyperlipidemia labs were reviewed with patient he is continue medication watch diet  Blood pressure good control continue current medications good control watch diet  Bilateral sciatica with right leg weakness-x-rays indicated more than likely will need MRI anti-inflammatory when necessary MRI indicated because of impingement of nerves causing weakness into the foot  Addendum-x-ray shows degenerative changes because of the weakness in the right foot along with severe sciatica I recommend MRI. Lumbar MRI certainly follow-up depending on the results

## 2017-05-21 NOTE — Patient Instructions (Signed)
Your lab work overall looks good. No need to repeat this currently. We will relook at this again in the spring.  Your up-to-date on your colonoscopy. This will be due again in 2025.  Please do your lumbar x-rays. We may need to order MRI. Also based on all this may end up needing to see specialists as well.

## 2017-05-21 NOTE — Telephone Encounter (Signed)
Fax from [facility]. Chlorzoxazone denied. Med is not on the drug list. Plan does not cover unless pt has tried and failed 4 of the preferred meds. Please see list in your folder.

## 2017-05-21 NOTE — Progress Notes (Signed)
   Subjective:    Patient ID: Shane Young, male    DOB: [birth year], 71 y.o.   MRN: [id number]  HPI    Review of Systems     Objective:   Physical Exam        Assessment & Plan:

## 2017-05-22 ENCOUNTER — Ambulatory Visit (HOSPITAL_COMMUNITY)
Admission: RE | Admit: 2017-05-22 | Discharge: 2017-05-22 | Disposition: A | Discharge status: Home / Self Care | Payer: Medicare Other | Source: Ambulatory Visit | Attending: Family Medicine | Admitting: Family Medicine

## 2017-05-22 DIAGNOSIS — M5431 Sciatica, right side: Secondary | ICD-10-CM | POA: Diagnosis not present

## 2017-05-22 DIAGNOSIS — M5432 Sciatica, left side: Secondary | ICD-10-CM | POA: Insufficient documentation

## 2017-05-22 DIAGNOSIS — M419 Scoliosis, unspecified: Secondary | ICD-10-CM | POA: Insufficient documentation

## 2017-05-22 DIAGNOSIS — I700 Atherosclerosis of aorta: Secondary | ICD-10-CM | POA: Insufficient documentation

## 2017-05-22 DIAGNOSIS — I708 Atherosclerosis of other arteries: Secondary | ICD-10-CM | POA: Insufficient documentation

## 2017-05-24 ENCOUNTER — Other Ambulatory Visit: Payer: Self-pay

## 2017-05-24 DIAGNOSIS — M5432 Sciatica, left side: Principal | ICD-10-CM

## 2017-05-24 DIAGNOSIS — M5431 Sciatica, right side: Secondary | ICD-10-CM

## 2017-05-25 ENCOUNTER — Telehealth: Payer: Self-pay | Admitting: Family Medicine

## 2017-05-25 NOTE — Telephone Encounter (Signed)
Spoke with patient and informed him per Dr.Scott Luking- Tylenol may be used with diclofenac. Maximum dose of Tylenol 500 mg no more than 6 in a day. Patient's wife verbalized

## 2017-05-25 NOTE — Telephone Encounter (Signed)
Pt's wife is wanting to know if the pt can take tylenol along with the anti inflammatory medicine that he was prescribed.

## 2017-05-25 NOTE — Telephone Encounter (Signed)
Tylenol may be used with diclofenac. Maximum dose of Tylenol 500 mg no more than 6 in a day.

## 2017-05-30 ENCOUNTER — Telehealth: Payer: Self-pay | Admitting: Family Medicine

## 2017-05-30 DIAGNOSIS — R9389 Abnormal findings on diagnostic imaging of other specified body structures: Secondary | ICD-10-CM

## 2017-05-30 NOTE — Telephone Encounter (Signed)
Called them and they have not read report yet. They state we should get report tomorrow. Cd with mri in nurses basket. Await paper report. Call back if do not receive it on 8/30

## 2017-05-30 NOTE — Telephone Encounter (Signed)
Nurses please call [facility] -I need the radiologist report on this

## 2017-05-30 NOTE — Telephone Encounter (Signed)
Patients spouse dropped off MRI Lumbar CD for you to review.  See in red folder.

## 2017-05-31 NOTE — Telephone Encounter (Signed)
Mri reviewed unfortunately bulging discs pressing on the nervews which may be leading to the pain, also degenerative changes calusing the spinal canal to narrow (called spinal stenosis) which can also cause pain, needs neurosurg ref

## 2017-05-31 NOTE — Telephone Encounter (Signed)
Please see MRI report in green folder in yellow box

## 2017-05-31 NOTE — Telephone Encounter (Signed)
Results discussed with patient. Patient advised Mri reviewed -unfortunately bulging discs pressing on the nervews which may be leading to the pain, also degenerative changes causing the spinal canal to narrow (called spinal stenosis) which can also cause pain, needs neurosurgeon referral. Patient verbalized understanding and stated he did not want his CD back. [facility]

## 2017-06-01 ENCOUNTER — Encounter: Payer: Self-pay | Admitting: Family Medicine

## 2017-06-11 ENCOUNTER — Telehealth: Payer: Self-pay | Admitting: Family Medicine

## 2017-06-11 NOTE — Telephone Encounter (Signed)
I called and left a message that the CD was placed at the front desk for him to pick up.CS

## 2017-06-11 NOTE — Telephone Encounter (Signed)
[place] the disc for his MRI that he dropped off on 05/30/17.  He would like to pick this up.

## 2017-06-27 ENCOUNTER — Ambulatory Visit (HOSPITAL_COMMUNITY): Payer: Medicare Other | Attending: Neurosurgery

## 2017-06-27 ENCOUNTER — Encounter (HOSPITAL_COMMUNITY): Payer: Self-pay

## 2017-06-27 DIAGNOSIS — R262 Difficulty in walking, not elsewhere classified: Secondary | ICD-10-CM | POA: Insufficient documentation

## 2017-06-27 DIAGNOSIS — R2681 Unsteadiness on feet: Secondary | ICD-10-CM | POA: Diagnosis present

## 2017-06-27 DIAGNOSIS — R29818 Other symptoms and signs involving the nervous system: Secondary | ICD-10-CM | POA: Insufficient documentation

## 2017-06-27 NOTE — Therapy (Addendum)
[facility] [address] [place], [place], [address] Phone: [phone]   Fax:  [phone]  Physical Therapy Evaluation  Patient Details  Name: Shane Young MRN: [id number] Date of Birth: 04/16/1946 No Data Recorded  Encounter Date: 06/27/2017      PT End of Session - 06/27/17 1525    Visit Number 1   Number of Visits 16   Date for PT Re-Evaluation 07/27/17   Authorization Type U[employer] Medicare    Authorization Time Period 06/27/17-08/27/17   Authorization - Visit Number 1   Authorization - Number of Visits 10   PT Start Time 1350   PT Stop Time 1435   PT Time Calculation (min) 45 min   Activity Tolerance Patient tolerated treatment well;Patient limited by pain;Patient limited by fatigue   Behavior During Therapy [facility] for tasks assessed/performed      Past Medical History:  Diagnosis Date  . Hyperlipidemia   . Hypertension     Past Surgical History:  Procedure Laterality Date  . APPENDECTOMY    . COLON SURGERY     55 years ago   . COLONOSCOPY N/A 08/17/2014   Procedure: COLONOSCOPY;  Surgeon: Corbin Ade, MD;  Location[facility];  Service: Endoscopy;  Laterality: N/A;  8:30 AM    There were no vitals filed for this visit.       Subjective Assessment - 06/27/17 1357    Subjective On August 4th, patient was unable to get out of bed due to acute back pain with bilat lower extremity burning pain into the toes, with pins and needles sensation. He has since been limited by standing and walkign due to continued cramping/spasm in BLE, but no longer having numbness/tingling in legs. Pain free in sitting. Pt denies B&B signs.    Pertinent History No prior history of back pain or similar symptoms.    Limitations Standing;Walking   How long can you stand comfortably? able to perform all ADL without limitations   How long can you walk comfortably? Tested in session    Diagnostic tests Xray, MRI neurological    Patient  Stated Goals return to work    Currently in Pain? No/denies   Aggravating Factors  walking sustained   Pain Relieving Factors sitting             [facility] PT Assessment - 06/27/17 0001      Assessment   Medical Diagnosis Lumbar Neurogenic Claudication    Onset Date/Surgical Date --  August 2018   Next MD Visit Maeola Harman, MD   Prior Therapy none     Precautions   Precautions None   Precaution Comments avoid lumbar extension     Balance Screen   Has the patient fallen in the past 6 months No   Has the patient had a decrease in activity level because of a fear of falling?  No   Is the patient reluctant to leave their home because of a fear of falling?  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          [facility] Adult PT Treatment/Exercise - 06/27/17 0001      Ambulation/Gait   Gait Comments bilat trendelenburg, L>R   symptoms onset at 225, Sx concerning at 488ft     Exercises   Exercises Lumbar     Lumbar Exercises: Stretches   Double Knee to Chest Stretch 3 reps;30 seconds  lumbar flexion stretch    Double Knee to Chest Stretch Limitations 2x25sec repeated lumbar flexion    Lower Trunk Rotation Limitations seated chair flexion stretch 2x30sec bilat  10x5sec in slight flexion (not added  to HEP)      Lumbar Exercises: Seated   Other Seated Lumbar Exercises marching seated: 1x10 bikat     Lumbar Exercises: Supine   Other Supine Lumbar Exercises bent knee raise in hooklying: 1x10 bilat alternating         06/27/17 1529  PT SHORT TERM GOAL #1  Title After 4 weeks patient will demonstrate improved tolerance to functional activity, AMB >434ft without onset of LE symptoms.   Status New  PT SHORT TERM GOAL #2  Title After 4 weeks patient will demonstrate improved maximal gait speed AEB >1.25m/s.   Status New  PT SHORT TERM GOAL #3  Title After 4 weeks patient will demonstrate improved bilateral hip stabilizer function AEB performance of SLS>20sec bilat.   Status New  PT SHORT TERM GOAL #4  Title After 4 weeks patient will demonstrate improved lumbar spine stabilization AEB 3x10 chair squats with 25lb loading without exaceberation of low back pain or BLE symptoms.   Status New       06/27/17 1530  PT LONG TERM GOAL #1  Title After 8 weeks patient will demonstrate improved tolerance to functional activity, AMB >163ft without onset of LE symptoms.   Status New  PT LONG TERM GOAL #2  Title After 8 weeks patient will demonstrate improved maximal gait speed AEB >1.10m/s.   Status New  PT LONG TERM GOAL #3  Title After 8 weeks patient will demonstrate improved bilateral hip stabilizer function AEB performance of SLS>30 sec bilat on Bosu firm side up.   Status New  PT LONG TERM GOAL #4  Title After 8 weeks patient will demonstrate improved lumbar spine stabilization AEB 3x6 chair squats with 40lb loading without exaceberation of low back pain or BLE symptoms.   Status New             PT Education - 06/27/17 1523    Education provided Yes   Education Details explained how lumbar extension adn lordosis narrows neurological spaces and how flexion can open these spaces minimizing symptoms. Explained how trunk flexor strength and motor control training will  allow long term benefit.    Person(s) Educated Patient   Methods Explanation;Demonstration   Comprehension Verbalized understanding                     Plan - 06/27/17 1531    Clinical Impression Statement Shane Young is a 71yo male who comes to [facility] with referral for neurogenic claudication. His CC at present  is posterio BLE cramping with prolonged AMB. Today funcitonal movement demosntrates moderate-heavy loss of pelvis control during gait with strong trendelenbuerg gait bilat L>R, which progresses over the course of 448feet. Pt tolrates all flexion based actiivty without any exacerbation in symptoms, only some stretching feelings in the low back. Additonal objective assessmen tdetails in the note.  PT skilled intervention will target improvements in lumbo pelvis stability during functional activity to reduce neurological symptoms and pain associated with movement and/or lordosis to facilitate a faster return to PLOF and return to work.    History and Personal Factors relevant to plan of care: Very physical job with heavy lifting requirements   Clinical Presentation Stable   Clinical Presentation due to: mechanical symptoms with decreased severeity since initital onset.    Clinical Decision Making Moderate   Rehab Potential Good   PT Frequency 2x / week   PT Duration 8 weeks   PT Treatment/Interventions ADLs/Self Care Home Management;Patient/family education;Dry needling;Energy conservation;Electrical Stimulation;Gait training;Functional mobility training;Therapeutic activities;Therapeutic exercise;Balance training;Neuromuscular re-education;Passive range of motion;Manual techniques   PT Next Visit Plan Review goals and HEP, ascertain symptoms response, Thoas Test to determine Psoas restrictions, Asses hip extension ROM if able to do without increaseing lordosis.    PT Home Exercise Plan DKTC 3x30sec, DKTC rocking 25x, SKTC in hookling 2x30sec, Supine Marching 2x10---all 3x  daily.    Consulted and Agree with Plan of Care Patient      Patient will benefit from skilled therapeutic intervention in order to improve the following deficits and impairments:  Abnormal gait, Decreased activity tolerance, Decreased range of motion, Decreased strength, Difficulty walking, Hypomobility, Postural dysfunction, Improper body mechanics, Pain  Visit Diagnosis: Other symptoms and signs involving the nervous system - Plan: PT plan of care cert/re-cert  Difficulty in walking, not elsewhere classified - Plan: PT plan of care cert/re-cert  Unsteadiness on feet - Plan: PT plan of care cert/re-cert      G-Codes - July 18, 2017 1546    Functional Assessment Tool Used (Outpatient Only) Clinical Judgment    Functional Limitation Mobility: Walking and moving around   Mobility: Walking and Moving Around Current Status (Z6109) At least 60 percent but less than 80 percent impaired, limited or restricted   Mobility: Walking and Moving Around Goal Status (U0454) At least 40 percent but less than 60 percent impaired, limited or restricted       Problem List Patient Active Problem List   Diagnosis Date Noted  . Bilateral sciatica 05/21/2017  . Essential hypertension, benign 02/19/2013  . Hyperlipidemia 02/19/2013    Nhi Butrum C 2017/07/18, 3:56 PM  [facility] [address] [place], [place], [address] Phone: [phone]   Fax:  [phone]  Name: Shane Young MRN: [id number] Date of Birth: July 14, 1946   *addendum on 9/28 to include ST/LT goals based on objective findings from evaluation visit.  9:59 AM, 06/29/17 Rosamaria Lints, PT, DPT Physical Therapist - [place] [phone] (Office)

## 2017-06-29 ENCOUNTER — Ambulatory Visit (HOSPITAL_COMMUNITY): Payer: Medicare Other

## 2017-06-29 ENCOUNTER — Encounter (HOSPITAL_COMMUNITY): Payer: Self-pay

## 2017-06-29 DIAGNOSIS — R2681 Unsteadiness on feet: Secondary | ICD-10-CM

## 2017-06-29 DIAGNOSIS — R262 Difficulty in walking, not elsewhere classified: Secondary | ICD-10-CM

## 2017-06-29 DIAGNOSIS — R29818 Other symptoms and signs involving the nervous system: Secondary | ICD-10-CM | POA: Diagnosis not present

## 2017-06-29 NOTE — Therapy (Signed)
[facility] [address] [place], [place], [address] Phone: [phone]   Fax:  [phone]  Physical Therapy Treatment  Patient Details  Name: Shane Young MRN: [id number] Date of Birth: 1946/04/12 No Data Recorded  Encounter Date: 06/29/2017      PT End of Session - 06/29/17 0809    Visit Number 2   Number of Visits 16   Date for PT Re-Evaluation 07/27/17   Authorization Type UHC Medicare    Authorization Time Period 06/27/17-08/27/17   Authorization - Visit Number 2   Authorization - Number of Visits 10   PT Start Time 0815   PT Stop Time 0855   PT Time Calculation (min) 40 min   Activity Tolerance Patient tolerated treatment well;Patient limited by pain;Patient limited by fatigue   Behavior During Therapy [facility] for tasks assessed/performed      Past Medical History:  Diagnosis Date  . Hyperlipidemia   . Hypertension     Past Surgical History:  Procedure Laterality Date  . APPENDECTOMY    . COLON SURGERY     55 years ago   . COLONOSCOPY N/A 08/17/2014   Procedure: COLONOSCOPY;  Surgeon: Corbin Ade, MD;  Location[facility];  Service: Endoscopy;  Laterality: N/A;  8:30 AM    There were no vitals filed for this visit.      Subjective Assessment - 06/29/17 0810    Subjective Pt states that if he could get rid of the soreness in the back of his legs he would be doing good. Pt states that his muscle gets stiff when he walks, he's just sore.    Pertinent History No prior history of back pain or similar symptoms.    Limitations Standing;Walking   How long can you stand comfortably? able to perform all ADL without limitations   How long can you walk comfortably? Tested in session    Diagnostic tests Xray, MRI neurological    Patient Stated Goals return to work             [facility] PT Assessment - 06/29/17 0001      PROM   Left Hip Extension ~15 deg BLE  ~5 deg AROM L hip ext, ~10 deg AROM R hip ext     Special Tests    Special Tests Hip Special Tests   Hip Special Tests  [facility] Test    Comments + rectus femoris tightness BLE, - psoas BLE              [facility] Adult PT Treatment/Exercise - 06/29/17 0001      Lumbar Exercises: Stretches   Active Hamstring Stretch 2 reps;30 seconds   Active Hamstring Stretch Limitations BLE, supine with rope   Single Knee to Chest Stretch 2 reps;30 seconds   Single Knee to Chest Stretch Limitations contralateral limb hooklying   Prone on Elbows Stretch Limitations bil standing calf stretch on step 2x30 sec     Lumbar Exercises: Standing   Other Standing Lumbar Exercises bil SLS x3-5" holds and pulldonw with RTB x5 reps each     Lumbar Exercises: Supine   Bridge 10 reps   Bridge Limitations 2 sets   Other Supine Lumbar Exercises bent knee raise in hooklying:2x10 bilat alternating and pulldowns with purple band     Lumbar Exercises: Sidelying   Clam 10 reps   Clam Limitations 2 sets BLE  PT Education - 06/29/17 0810    Education provided Yes   Education Details exercise technique; can add standing calf stretch to HEP   Person(s) Educated Patient   Methods Explanation;Demonstration   Comprehension Verbalized understanding;Returned demonstration             Plan - 06/29/17 0855    Clinical Impression Statement Began session by performing Maisie Fus test and hip extension ROM assessment; pt negative for psoas tightness but did demo rectus tightness as both knees extended during the testing. Flexion-based activities were the focus of today's session with no exacerbations in pain. Introduced pt to HS stretching and calf stretching this date with good tolerance, but he reported that each were tight. Continue POC as planned with flexion-based activities and promoting improved tolerance to movement.   Rehab Potential Good   PT Frequency 2x / week   PT Duration 8 weeks   PT Treatment/Interventions ADLs/Self Care  Home Management;Patient/family education;Dry needling;Energy conservation;Electrical Stimulation;Gait training;Functional mobility training;Therapeutic activities;Therapeutic exercise;Balance training;Neuromuscular re-education;Passive range of motion;Manual techniques   PT Next Visit Plan flexion-based activities for pain control/management; gluteal strengthening, improved tolerance to movement   PT Home Exercise Plan DKTC 3x30sec, DKTC rocking 25x, SKTC in hookling 2x30sec, Supine Marching 2x10---all 3x daily. 9/28: standing calf stretch   Consulted and Agree with Plan of Care Patient      Patient will benefit from skilled therapeutic intervention in order to improve the following deficits and impairments:  Abnormal gait, Decreased activity tolerance, Decreased range of motion, Decreased strength, Difficulty walking, Hypomobility, Postural dysfunction, Improper body mechanics, Pain  Visit Diagnosis: Other symptoms and signs involving the nervous system  Difficulty in walking, not elsewhere classified  Unsteadiness on feet     Problem List Patient Active Problem List   Diagnosis Date Noted  . Bilateral sciatica 05/21/2017  . Essential hypertension, benign 02/19/2013  . Hyperlipidemia 02/19/2013       Jac Canavan PT, DPT  [facility] [address] [place], [place], [address] Phone: [phone]   Fax:  [phone]  Name: Shane Young MRN: [id number] Date of Birth: November 30, 1945

## 2017-07-04 ENCOUNTER — Ambulatory Visit (HOSPITAL_COMMUNITY): Payer: Medicare Other | Attending: Neurosurgery

## 2017-07-04 DIAGNOSIS — R262 Difficulty in walking, not elsewhere classified: Secondary | ICD-10-CM | POA: Diagnosis present

## 2017-07-04 DIAGNOSIS — R2681 Unsteadiness on feet: Secondary | ICD-10-CM | POA: Diagnosis present

## 2017-07-04 DIAGNOSIS — R29818 Other symptoms and signs involving the nervous system: Secondary | ICD-10-CM | POA: Diagnosis present

## 2017-07-04 NOTE — Therapy (Signed)
[facility] [address] [place], [place], [address] Phone: [phone]   Fax:  [phone]  Physical Therapy Treatment  Patient Details  Name: Shane Young MRN: [id number] Date of Birth: [birth year] No Data Recorded  Encounter Date: 07/04/2017      PT End of Session - 07/04/17 0913    Visit Number 3   Number of Visits 16   Date for PT Re-Evaluation 07/27/17   Authorization Type UHC Medicare    Authorization Time Period 06/27/17-08/27/17   Authorization - Visit Number 3   Authorization - Number of Visits 10   PT Start Time 0856   PT Stop Time 0936   PT Time Calculation (min) 40 min   Activity Tolerance Patient tolerated treatment well;Patient limited by pain;Patient limited by fatigue   Behavior During Therapy [facility] for tasks assessed/performed      Past Medical History:  Diagnosis Date  . Hyperlipidemia   . Hypertension     Past Surgical History:  Procedure Laterality Date  . APPENDECTOMY    . COLON SURGERY     55 years ago   . COLONOSCOPY N/A 08/17/2014   Procedure: COLONOSCOPY;  Surgeon: Corbin Ade, MD;  [facility];  Service: Endoscopy;  Laterality: N/A;  8:30 AM    There were no vitals filed for this visit.      Subjective Assessment - 07/04/17 0902    Subjective Pt performing HEP 2x daily, not three times, but he continues to feel stronger and less painful. he got on his tractor yesterday.    Pertinent History No prior history of back pain or similar symptoms.    Currently in Pain? No/denies  tightness in calves bilat, L>R                         [facility] Adult PT Treatment/Exercise - 07/04/17 0001      Ambulation/Gait   Ambulation Distance (Feet) 675 Feet   Gait velocity 0.10m/s   Gait Comments improved pelvic control since evlal continueds [place] mildly.      Lumbar Exercises: Stretches   Active Hamstring Stretch --  seated sciatic nerveflossing, 2x25   Single Knee  to Chest Stretch 2 reps;30 seconds   Single Knee to Chest Stretch Limitations contralateral limb hooklying   Double Knee to Chest Stretch 30 seconds;2 reps  lumbar flexion stretch; seated floor touch   Lower Trunk Rotation Limitations seated chair flexion stretch 2x30sec bilat  10x5sec in slight flexion (not added to HEP)      Lumbar Exercises: Seated   Other Seated Lumbar Exercises seated trunk extension (deadlift)   2x10, rangelimited to avoid sciatic pulling on left.      Lumbar Exercises: Supine   Clam --  2x15 GTB   Bridge 10 reps   Bridge Limitations 2 setsx15  VC toadjust leg postiioning to avoid sciatic pulling   Other Supine Lumbar Exercises Reverse Curl Up: 2x15      Lumbar Exercises: Sidelying   Clam 10 reps   Clam Limitations 2 sets BLE     Manual Therapy   Manual Therapy Soft tissue mobilization;Myofascial release;Joint mobilization   Manual therapy comments Lumbar Spine AP mobs Grave IV/III:   5x30sec   Joint Mobilization Hip AP GLide 2x30sec Left; 2x30sec Rt hip ext stretch   pin prone, pillow under pelvis to avoid lordosis   Myofascial Release Hamstrings bilat, calves bilat:  PT Short Term Goals - 06/27/17 1529      PT SHORT TERM GOAL #1   Title After 4 weeks patient will demonstrate improved tolerance to functional activity, AMB >47ft without onset of LE symptoms.    Status New     PT SHORT TERM GOAL #2   Title After 4 weeks patient will demonstrate improved maximal gait speed AEB >1.70m/s.    Status New     PT SHORT TERM GOAL #3   Title After 4 weeks patient will demonstrate improved bilateral hip stabilizer function AEB performance of SLS>20sec bilat.    Status New     PT SHORT TERM GOAL #4   Title After 4 weeks patient will demonstrate improved lumbar spine stabilization AEB 3x10 chair squats with 25lb loading without exaceberation of low back pain or BLE symptoms.    Status New           PT Long Term Goals  - 06/27/17 1530      PT LONG TERM GOAL #1   Title After 8 weeks patient will demonstrate improved tolerance to functional activity, AMB >1630ft without onset of LE symptoms.    Status New     PT LONG TERM GOAL #2   Title After 8 weeks patient will demonstrate improved maximal gait speed AEB >1.6m/s.    Status New     PT LONG TERM GOAL #3   Title After 8 weeks patient will demonstrate improved bilateral hip stabilizer function AEB performance of SLS>30 sec bilat on Bosu firm side up.    Status New     PT LONG TERM GOAL #4   Title After 8 weeks patient will demonstrate improved lumbar spine stabilization AEB 3x6 chair squats with 40lb loading without exaceberation of low back pain or BLE symptoms.    Status New               Plan - 07/04/17 0914    Clinical Impression Statement Continued lumbar flexion mobility focus,and flexion preference. Basic strengthening of lumbar spine extensors and trunk flexors. Gait tolerance is improving now distance 50% greaterthan 1WA at eval. Integrated some sciatic nerve flossing this session as well.. Sciatic tension ispositive in hooklying near end range hamstrings length,worse with plantar flexion. Manual therapy incorporated. At time difficult to tell if 'pulling' sensation is sciatic tension or ham, strings/calf tension so encourgaed the patient to find comfortable range for all exercises to be conservative in this area..    Rehab Potential Good   PT Frequency 2x / week   PT Duration 8 weeks   PT Treatment/Interventions ADLs/Self Care Home Management;Patient/family education;Dry needling;Energy conservation;Electrical Stimulation;Gait training;Functional mobility training;Therapeutic activities;Therapeutic exercise;Balance training;Neuromuscular re-education;Passive range of motion;Manual techniques   PT Next Visit Plan At time difficult to tell if 'pulling' sensation is sciatic tension or ham, strings/calf tension so encourgaed the patient  to find comfortable range for all exercises to be conservative in this area (particularly with calf stretches of hamstrings stretches), flexion-based activities for pain control/management; gluteal strengthening, improved tolerance to movement   PT Home Exercise Plan DKTC 3x30sec, DKTC rocking 25x, SKTC in hookling 2x30sec, Supine Marching 2x10---all 3x daily. 9/28: standing calf stretch   Consulted and Agree with Plan of Care Patient      Patient will benefit from skilled therapeutic intervention in order to improve the following deficits and impairments:  Abnormal gait, Decreased activity tolerance, Decreased range of motion, Decreased strength, Difficulty walking, Hypomobility, Postural dysfunction, Improper body mechanics, Pain  Visit  Diagnosis: Other symptoms and signs involving the nervous system  Difficulty in walking, not elsewhere classified  Unsteadiness on feet     Problem List Patient Active Problem List   Diagnosis Date Noted  . Bilateral sciatica 05/21/2017  . Essential hypertension, benign 02/19/2013  . Hyperlipidemia 02/19/2013    Buccola,Allan C 07/04/2017, 9:44 AM   9:45 AM, 07/04/17 Rosamaria Lints, PT, DPT Physical Therapist - [place] [phone] (Office)    [facility] [address] [place], [place], [address] Phone: [phone]   Fax:  [phone]  Name: RASHAAD HALLSTROM MRN: [id number] Date of Birth: May 31, 1946

## 2017-07-06 ENCOUNTER — Encounter (HOSPITAL_COMMUNITY): Payer: Self-pay

## 2017-07-06 ENCOUNTER — Ambulatory Visit (HOSPITAL_COMMUNITY): Payer: Medicare Other

## 2017-07-06 DIAGNOSIS — R29818 Other symptoms and signs involving the nervous system: Secondary | ICD-10-CM | POA: Diagnosis not present

## 2017-07-06 DIAGNOSIS — R2681 Unsteadiness on feet: Secondary | ICD-10-CM

## 2017-07-06 DIAGNOSIS — R262 Difficulty in walking, not elsewhere classified: Secondary | ICD-10-CM

## 2017-07-06 NOTE — Patient Instructions (Signed)
  BRIDGING  While lying on your back, tighten your lower abdominals, squeeze your buttocks and then raise your buttocks off the floor/bed as creating a "Bridge" with your body. Hold and then lower yourself and repeat.  Perform 1-2x/day, 2-3 sets of 15 reps   ELASTIC BAND - SIDELYING CLAM - CL[place]HELL   While lying on your side with your knees bent and an elastic band wrapped around your knees, draw up the top knee while keeping contact of your feet together as shown.   Do not let your pelvis roll back during the lifting movement.    Perform 1-2x/day, 2-3 sets of 15 reps with GTB on each side

## 2017-07-06 NOTE — Therapy (Signed)
[facility] [address] [place], [place], [address] Phone: [phone]   Fax:  [phone]  Physical Therapy Treatment  Patient Details  Name: Shane Young MRN: [id number] Date of Birth: [birth year] No Data Recorded  Encounter Date: 07/06/2017      PT End of Session - 07/06/17 0815    Visit Number 4   Number of Visits 16   Date for PT Re-Evaluation 07/27/17   Authorization Type UHC Medicare    Authorization Time Period 06/27/17-08/27/17   Authorization - Visit Number 4   Authorization - Number of Visits 10   PT Start Time 0815   PT Stop Time 0858   PT Time Calculation (min) 43 min   Activity Tolerance Patient tolerated treatment well;Patient limited by pain;Patient limited by fatigue   Behavior During Therapy [facility] for tasks assessed/performed      Past Medical History:  Diagnosis Date  . Hyperlipidemia   . Hypertension     Past Surgical History:  Procedure Laterality Date  . APPENDECTOMY    . COLON SURGERY     55 years ago   . COLONOSCOPY N/A 08/17/2014   Procedure: COLONOSCOPY;  Surgeon: Corbin Ade, MD;  [facility];  Service: Endoscopy;  Laterality: N/A;  8:30 AM    There were no vitals filed for this visit.      Subjective Assessment - 07/06/17 0815    Subjective Pt states that his calf and legs are still sore. He was hurting in his hip yesterday but it's not hurting now. He reports compliance with his HEP.    Pertinent History No prior history of back pain or similar symptoms.    Currently in Pain? No/denies                 [facility] Adult PT Treatment/Exercise - 07/06/17 0001      Lumbar Exercises: Standing   Other Standing Lumbar Exercises gait training x3 laps following manual    Other Standing Lumbar Exercises standing hip abd with GTB x15 reps each     Lumbar Exercises: Seated   Other Seated Lumbar Exercises seated trunk extension (deadlift)   2x15 with 5# DB; within range  tolerance     Lumbar Exercises: Supine   Bridge Limitations 2x15, in comfortable range   Other Supine Lumbar Exercises supine sciatic nerve flossing x15 reps each (pulling in calf)     Lumbar Exercises: Sidelying   Clam 15 reps   Clam Limitations 2 sets BLE, GTB     Manual Therapy   Manual therapy comments Lumbar Spine AP mobs Grave IV/III:    Myofascial Release bil calves              PT Education - 07/06/17 0818    Education provided Yes   Education Details added bridging and clamshells to HEP   Person(s) Educated Patient   Methods Explanation;Demonstration;Handout   Comprehension Verbalized understanding;Returned demonstration          PT Short Term Goals - 06/27/17 1529      PT SHORT TERM GOAL #1   Title After 4 weeks patient will demonstrate improved tolerance to functional activity, AMB >429ft without onset of LE symptoms.    Status New     PT SHORT TERM GOAL #2   Title After 4 weeks patient will demonstrate improved maximal gait speed AEB >1.24m/s.    Status New     PT SHORT TERM GOAL #3   Title  After 4 weeks patient will demonstrate improved bilateral hip stabilizer function AEB performance of SLS>20sec bilat.    Status New     PT SHORT TERM GOAL #4   Title After 4 weeks patient will demonstrate improved lumbar spine stabilization AEB 3x10 chair squats with 25lb loading without exaceberation of low back pain or BLE symptoms.    Status New           PT Long Term Goals - 06/27/17 1530      PT LONG TERM GOAL #1   Title After 8 weeks patient will demonstrate improved tolerance to functional activity, AMB >1634ft without onset of LE symptoms.    Status New     PT LONG TERM GOAL #2   Title After 8 weeks patient will demonstrate improved maximal gait speed AEB >1.24m/s.    Status New     PT LONG TERM GOAL #3   Title After 8 weeks patient will demonstrate improved bilateral hip stabilizer function AEB performance of SLS>30 sec bilat on  Bosu firm side up.    Status New     PT LONG TERM GOAL #4   Title After 8 weeks patient will demonstrate improved lumbar spine stabilization AEB 3x6 chair squats with 40lb loading without exaceberation of low back pain or BLE symptoms.    Status New               Plan - 07/06/17 0859    Clinical Impression Statement Session focused on improving tolerance to extension and MFR to bil calves. Pt continues with palpable knots of bil calves and reports reproduction of symptoms. He still c/o pulling sensation in L HS with amb and upright tasks. Pt able to ambulate 3 laps around gym with min reports of L HS pulling, but reported this was much improved from the beginning of the session. Pt reports steady improvements in function and walking. Continue POC   Rehab Potential Good   PT Frequency 2x / week   PT Duration 8 weeks   PT Treatment/Interventions ADLs/Self Care Home Management;Patient/family education;Dry needling;Energy conservation;Electrical Stimulation;Gait training;Functional mobility training;Therapeutic activities;Therapeutic exercise;Balance training;Neuromuscular re-education;Passive range of motion;Manual techniques   PT Next Visit Plan At time difficult to tell if 'pulling' sensation is sciatic tension or ham, strings/calf tension so encourgaed the patient to find comfortable range for all exercises to be conservative in this area (particularly with calf stretches of hamstrings stretches), flexion-based activities for pain control/management; continue with gluteal strengthening, improved tolerance to movement   PT Home Exercise Plan DKTC 3x30sec, DKTC rocking 25x, SKTC in hookling 2x30sec, Supine Marching 2x10---all 3x daily. 9/28: standing calf stretch; 10/5: bridge, sidelying clamshesll with GTB   Consulted and Agree with Plan of Care Patient      Patient will benefit from skilled therapeutic intervention in order to improve the following deficits and impairments:  Abnormal  gait, Decreased activity tolerance, Decreased range of motion, Decreased strength, Difficulty walking, Hypomobility, Postural dysfunction, Improper body mechanics, Pain  Visit Diagnosis: Other symptoms and signs involving the nervous system  Difficulty in walking, not elsewhere classified  Unsteadiness on feet     Problem List Patient Active Problem List   Diagnosis Date Noted  . Bilateral sciatica 05/21/2017  . Essential hypertension, benign 02/19/2013  . Hyperlipidemia 02/19/2013       Jac Canavan PT, DPT  [facility] [address] [place], [place], [address] Phone: [phone]   Fax:  [phone]  Name: Shane Young  MRN: [id number] Date of Birth: 31-May-1946

## 2017-07-10 ENCOUNTER — Ambulatory Visit (HOSPITAL_COMMUNITY): Payer: Medicare Other

## 2017-07-10 ENCOUNTER — Encounter (HOSPITAL_COMMUNITY): Payer: Self-pay

## 2017-07-10 DIAGNOSIS — R2681 Unsteadiness on feet: Secondary | ICD-10-CM

## 2017-07-10 DIAGNOSIS — R262 Difficulty in walking, not elsewhere classified: Secondary | ICD-10-CM

## 2017-07-10 DIAGNOSIS — R29818 Other symptoms and signs involving the nervous system: Secondary | ICD-10-CM | POA: Diagnosis not present

## 2017-07-10 NOTE — Therapy (Signed)
[facility] [address] [place], [place], [address] Phone: [phone]   Fax:  [phone]  Physical Therapy Treatment  Patient Details  Name: Shane Young MRN: [id number] Date of Birth: 02-25-1946 No Data Recorded  Encounter Date: 07/10/2017      PT End of Session - 07/10/17 0832    Visit Number 5   Number of Visits 16   Date for PT Re-Evaluation 07/27/17   Authorization Type UHC Medicare    Authorization Time Period 06/27/17-08/27/17   Authorization - Visit Number 5   Authorization - Number of Visits 10   PT Start Time 0815   PT Stop Time 0902   PT Time Calculation (min) 47 min   Activity Tolerance Patient tolerated treatment well;Patient limited by fatigue;No increased pain   Behavior During Therapy [facility] for tasks assessed/performed      Past Medical History:  Diagnosis Date  . Hyperlipidemia   . Hypertension     Past Surgical History:  Procedure Laterality Date  . APPENDECTOMY    . COLON SURGERY     55 years ago   . COLONOSCOPY N/A 08/17/2014   Procedure: COLONOSCOPY;  Surgeon: Corbin Ade, MD;  [facility];  Service: Endoscopy;  Laterality: N/A;  8:30 AM    There were no vitals filed for this visit.      Subjective Assessment - 07/10/17 0812    Subjective Pt stated his calves continue to be sore and reports burning down back of thigh to ankle following walking. Reports compliance with HEP daily.     Pertinent History No prior history of back pain or similar symptoms.    Patient Stated Goals return to work    Currently in Pain? No/denies  soreness, no pain scale given                         [facility] Adult PT Treatment/Exercise - 07/10/17 0001      Lumbar Exercises: Stretches   Active Hamstring Stretch 3 reps;30 seconds   Active Hamstring Stretch Limitations BLE, supine with rope   Piriformis Stretch 2 reps;30 seconds   Piriformis Stretch Limitations supine with towel     Lumbar Exercises: Standing   Other Standing Lumbar Exercises gait training x3 laps initial session and 2 laps following manual at EOS     Lumbar Exercises: Supine   Bridge 15 reps   Bridge Limitations reports increased sensations at end range; encouraged to complete in comfortable range   Other Supine Lumbar Exercises dead bug 15x 5"   Other Supine Lumbar Exercises supine sciatic nerve flossing x15 reps each (pulling in calf)     Lumbar Exercises: Sidelying   Hip Abduction 10 reps   Hip Abduction Weights (lbs) cueing for form     Manual Therapy   Manual Therapy Soft tissue mobilization   Manual therapy comments Manual complete separate than rest of tx   Soft tissue mobilization Prone position soft tissue mobilizaiton to Bil calves                  PT Short Term Goals - 06/27/17 1529      PT SHORT TERM GOAL #1   Title After 4 weeks patient will demonstrate improved tolerance to functional activity, AMB >473ft without onset of LE symptoms.    Status New     PT SHORT TERM GOAL #2   Title After 4 weeks patient will demonstrate improved maximal  gait speed AEB >1.46m/s.    Status New     PT SHORT TERM GOAL #3   Title After 4 weeks patient will demonstrate improved bilateral hip stabilizer function AEB performance of SLS>20sec bilat.    Status New     PT SHORT TERM GOAL #4   Title After 4 weeks patient will demonstrate improved lumbar spine stabilization AEB 3x10 chair squats with 25lb loading without exaceberation of low back pain or BLE symptoms.    Status New           PT Long Term Goals - 06/27/17 1530      PT LONG TERM GOAL #1   Title After 8 weeks patient will demonstrate improved tolerance to functional activity, AMB >1632ft without onset of LE symptoms.    Status New     PT LONG TERM GOAL #2   Title After 8 weeks patient will demonstrate improved maximal gait speed AEB >1.41m/s.    Status New     PT LONG TERM GOAL #3   Title After 8 weeks  patient will demonstrate improved bilateral hip stabilizer function AEB performance of SLS>30 sec bilat on Bosu firm side up.    Status New     PT LONG TERM GOAL #4   Title After 8 weeks patient will demonstrate improved lumbar spine stabilization AEB 3x6 chair squats with 40lb loading without exaceberation of low back pain or BLE symptoms.    Status New               Plan - 07/10/17 0841    Clinical Impression Statement Session focus on core and proximal strengthening with flexion based exercises and stretches to address radicular symptoms with manual to address tight musculature.  Pt reports increased radicular symptoms following gait.  Pt presents with tight hamstrings noted with pelvic alignment and increased BLE ER Rt>Lt during gait.  Reports of pain resolved following stretches and manual to LE, no reports of radicular symptoms at EOS.     Rehab Potential Good   PT Frequency 2x / week   PT Duration 8 weeks   PT Treatment/Interventions ADLs/Self Care Home Management;Patient/family education;Dry needling;Energy conservation;Electrical Stimulation;Gait training;Functional mobility training;Therapeutic activities;Therapeutic exercise;Balance training;Neuromuscular re-education;Passive range of motion;Manual techniques   PT Next Visit Plan At time difficult to tell if 'pulling' sensation is sciatic tension or ham, strings/calf tension so encourgaed the patient to find comfortable range for all exercises to be conservative in this area (particularly with calf stretches of hamstrings stretches), flexion-based activities for pain control/management; continue with gluteal strengthening, improved tolerance to movement   PT Home Exercise Plan DKTC 3x30sec, DKTC rocking 25x, SKTC in hookling 2x30sec, Supine Marching 2x10---all 3x daily. 9/28: standing calf stretch; 10/5: bridge, sidelying clamshesll with GTB      Patient will benefit from skilled therapeutic intervention in order to improve the  following deficits and impairments:  Abnormal gait, Decreased activity tolerance, Decreased range of motion, Decreased strength, Difficulty walking, Hypomobility, Postural dysfunction, Improper body mechanics, Pain  Visit Diagnosis: Other symptoms and signs involving the nervous system  Difficulty in walking, not elsewhere classified  Unsteadiness on feet     Problem List Patient Active Problem List   Diagnosis Date Noted  . Bilateral sciatica 05/21/2017  . Essential hypertension, benign 02/19/2013  . Hyperlipidemia 02/19/2013   Becky Sax, LPTA; CBIS [phone]  Juel Burrow 07/10/2017, 5:54 PM  [facility] [address] [place], [place], [address] Phone: [phone]  Fax:  [phone]  Name: Shane Young MRN: [id number] Date of Birth: 1946-04-24

## 2017-07-13 ENCOUNTER — Telehealth (HOSPITAL_COMMUNITY): Payer: Self-pay

## 2017-07-13 ENCOUNTER — Ambulatory Visit (HOSPITAL_COMMUNITY): Payer: Medicare Other
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
## 2017-07-17 ENCOUNTER — Ambulatory Visit (HOSPITAL_COMMUNITY): Payer: Medicare Other

## 2017-07-17 ENCOUNTER — Encounter (HOSPITAL_COMMUNITY): Payer: Self-pay

## 2017-07-17 DIAGNOSIS — R29818 Other symptoms and signs involving the nervous system: Secondary | ICD-10-CM | POA: Diagnosis not present

## 2017-07-17 DIAGNOSIS — R2681 Unsteadiness on feet: Secondary | ICD-10-CM

## 2017-07-17 DIAGNOSIS — R262 Difficulty in walking, not elsewhere classified: Secondary | ICD-10-CM

## 2017-07-17 NOTE — Therapy (Signed)
[facility] [address] [place], [place], [address] Phone: [phone]   Fax:  [phone]  Physical Therapy Treatment  Patient Details  Name: Shane Young MRN: [id number] Date of Birth: [birth year] No Data Recorded  Encounter Date: 07/17/2017      PT End of Session - 07/17/17 0814    Visit Number 6   Number of Visits 16   Date for PT Re-Evaluation 07/27/17   Authorization Type UHC Medicare    Authorization Time Period 06/27/17-08/27/17   Authorization - Visit Number 6   Authorization - Number of Visits 10   PT Start Time 0816   PT Stop Time 0857   PT Time Calculation (min) 41 min   Activity Tolerance Patient tolerated treatment well;Patient limited by fatigue;No increased pain   Behavior During Therapy WFL for tasks assessed/performed      Past Medical History:  Diagnosis Date  . Hyperlipidemia   . Hypertension     Past Surgical History:  Procedure Laterality Date  . APPENDECTOMY    . COLON SURGERY     55 years ago   . COLONOSCOPY N/A 08/17/2014   Procedure: COLONOSCOPY;  Surgeon: Corbin Ade, MD;  [facility];  Service: Endoscopy;  Laterality: N/A;  8:30 AM    There were no vitals filed for this visit.      Subjective Assessment - 07/17/17 0814    Subjective Pt states he had a tough day Sunday because he was walking a lot dealing with his generators. His calves are still pretty sore today.    Pertinent History No prior history of back pain or similar symptoms.    Patient Stated Goals return to work    Currently in Pain? No/denies               [facility] Adult PT Treatment/Exercise - 07/17/17 0001      Lumbar Exercises: Stretches   Active Hamstring Stretch 3 reps;30 seconds   Active Hamstring Stretch Limitations BLE, supine with rope     Lumbar Exercises: Standing   Other Standing Lumbar Exercises SLS with vectors x5RT each   Other Standing Lumbar Exercises hip IR step around 10x5" each;  sidestepping 52ft with RTB x2RT     Lumbar Exercises: Supine   Other Supine Lumbar Exercises supine sciatic nerve flossing x15 reps each (pulling in calf)     Manual Therapy   Manual Therapy Myofascial release   Manual therapy comments Manual complete separate than rest of tx   Myofascial Release L calf              PT Education - 07/17/17 0821    Education provided Yes   Education Details will update HEP next session   Person(s) Educated Patient   Methods Explanation;Demonstration   Comprehension Verbalized understanding;Returned demonstration          PT Short Term Goals - 06/27/17 1529      PT SHORT TERM GOAL #1   Title After 4 weeks patient will demonstrate improved tolerance to functional activity, AMB >441ft without onset of LE symptoms.    Status New     PT SHORT TERM GOAL #2   Title After 4 weeks patient will demonstrate improved maximal gait speed AEB >1.44m/s.    Status New     PT SHORT TERM GOAL #3   Title After 4 weeks patient will demonstrate improved bilateral hip stabilizer function AEB performance of SLS>20sec bilat.    Status  New     PT SHORT TERM GOAL #4   Title After 4 weeks patient will demonstrate improved lumbar spine stabilization AEB 3x10 chair squats with 25lb loading without exaceberation of low back pain or BLE symptoms.    Status New           PT Long Term Goals - 06/27/17 1530      PT LONG TERM GOAL #1   Title After 8 weeks patient will demonstrate improved tolerance to functional activity, AMB >1613ft without onset of LE symptoms.    Status New     PT LONG TERM GOAL #2   Title After 8 weeks patient will demonstrate improved maximal gait speed AEB >1.63m/s.    Status New     PT LONG TERM GOAL #3   Title After 8 weeks patient will demonstrate improved bilateral hip stabilizer function AEB performance of SLS>30 sec bilat on Bosu firm side up.    Status New     PT LONG TERM GOAL #4   Title After 8 weeks patient  will demonstrate improved lumbar spine stabilization AEB 3x6 chair squats with 40lb loading without exaceberation of low back pain or BLE symptoms.    Status New               Plan - 07/17/17 1610    Clinical Impression Statement Session focused on improving tolerance to extension and functional hip strengthening. Pt tolerated session well, reporting no pain during session, just some pulling in his calves. MFR to L calf performed and pt with increased restrictions. Pt reported no pain and no soreness at EOS.    Rehab Potential Good   PT Frequency 2x / week   PT Duration 8 weeks   PT Treatment/Interventions ADLs/Self Care Home Management;Patient/family education;Dry needling;Energy conservation;Electrical Stimulation;Gait training;Functional mobility training;Therapeutic activities;Therapeutic exercise;Balance training;Neuromuscular re-education;Passive range of motion;Manual techniques   PT Next Visit Plan At time difficult to tell if 'pulling' sensation is sciatic tension or ham, strings/calf tension so encourgaed the patient to find comfortable range for all exercises to be conservative in this area (particularly with calf stretches of hamstrings stretches), flexion-based activities for pain control/management; continue with gluteal strengthening, improved tolerance to movement; add step ups next session   PT Home Exercise Plan DKTC 3x30sec, DKTC rocking 25x, SKTC in hookling 2x30sec, Supine Marching 2x10---all 3x daily. 9/28: standing calf stretch; 10/5: bridge, sidelying clamshesll with GTB   Consulted and Agree with Plan of Care Patient      Patient will benefit from skilled therapeutic intervention in order to improve the following deficits and impairments:  Abnormal gait, Decreased activity tolerance, Decreased range of motion, Decreased strength, Difficulty walking, Hypomobility, Postural dysfunction, Improper body mechanics, Pain  Visit Diagnosis: Other symptoms and signs  involving the nervous system  Difficulty in walking, not elsewhere classified  Unsteadiness on feet     Problem List Patient Active Problem List   Diagnosis Date Noted  . Bilateral sciatica 05/21/2017  . Essential hypertension, benign 02/19/2013  . Hyperlipidemia 02/19/2013       Jac Canavan PT, DPT  [facility] [address] [place], [place], [address] Phone: [phone]   Fax:  [phone]  Name: Shane Young MRN: [id number] Date of Birth: December 13, 1945

## 2017-07-20 ENCOUNTER — Ambulatory Visit (HOSPITAL_COMMUNITY): Payer: Medicare Other

## 2017-07-20 ENCOUNTER — Encounter (HOSPITAL_COMMUNITY): Payer: Self-pay

## 2017-07-20 DIAGNOSIS — R2681 Unsteadiness on feet: Secondary | ICD-10-CM

## 2017-07-20 DIAGNOSIS — R29818 Other symptoms and signs involving the nervous system: Secondary | ICD-10-CM | POA: Diagnosis not present

## 2017-07-20 DIAGNOSIS — R262 Difficulty in walking, not elsewhere classified: Secondary | ICD-10-CM

## 2017-07-20 NOTE — Therapy (Signed)
[facility] [address] [place], [place], [address] Phone: [phone]   Fax:  [phone]  Physical Therapy Treatment  Patient Details  Name: Shane Young MRN: [id number] Date of Birth: 04/14/1946 No Data Recorded  Encounter Date: 07/20/2017      PT End of Session - 07/20/17 0810    Visit Number 7   Number of Visits 16   Date for PT Re-Evaluation 07/27/17   Authorization Type UHC Medicare    Authorization Time Period 06/27/17-08/27/17   Authorization - Visit Number 7   Authorization - Number of Visits 10   PT Start Time 0815   Activity Tolerance Patient tolerated treatment well;Patient limited by fatigue;No increased pain   Behavior During Therapy WFL for tasks assessed/performed      Past Medical History:  Diagnosis Date  . Hyperlipidemia   . Hypertension     Past Surgical History:  Procedure Laterality Date  . APPENDECTOMY    . COLON SURGERY     55 years ago   . COLONOSCOPY N/A 08/17/2014   Procedure: COLONOSCOPY;  Surgeon: Corbin Ade, MD;  [facility];  Service: Endoscopy;  Laterality: N/A;  8:30 AM    There were no vitals filed for this visit.      Subjective Assessment - 07/20/17 0810    Subjective Pt states that he is feeling pretty good today. He has been massaging his calves at home and that is helping. He states that he mowed for 4 hours yesterday with no issue.   Pertinent History No prior history of back pain or similar symptoms.    Patient Stated Goals return to work    Currently in Pain? No/denies               [facility] Adult PT Treatment/Exercise - 07/20/17 0001      Lumbar Exercises: Stretches   Active Hamstring Stretch 3 reps;30 seconds   Active Hamstring Stretch Limitations BLE standing with foot on 12" box   Single Knee to Chest Stretch 2 reps;30 seconds   Double Knee to Chest Stretch 2 reps;30 seconds   Prone on Elbows Stretch Limitations slant board stretch 3x30 sec      Lumbar Exercises: Standing   Row Limitations fwd step up with contralateral knee drive O96 each   Shoulder Adduction Limitations 4-way resisted walking 64ft x1RT each     Lumbar Exercises: Seated   Sit to Stand 10 reps   Sit to Stand Limitations cues for proper weight distribution through BLE     Lumbar Exercises: Sidelying   Hip Abduction 15 reps   Hip Abduction Weights (lbs) BLE     Lumbar Exercises: Prone   Straight Leg Raise 10 reps   Straight Leg Raises Limitations knee bent     Manual Therapy   Manual Therapy Myofascial release   Manual therapy comments Manual complete separate than rest of tx   Myofascial Release bil calves, R>L              PT Education - 07/20/17 0839    Education provided Yes   Education Details exercise technique, updated HEP   Person(s) Educated Patient   Methods Explanation;Demonstration;Handout   Comprehension Verbalized understanding;Returned demonstration          PT Short Term Goals - 06/27/17 1529      PT SHORT TERM GOAL #1   Title After 4 weeks patient will demonstrate improved tolerance to functional activity, AMB >446ft without onset  of LE symptoms.    Status New     PT SHORT TERM GOAL #2   Title After 4 weeks patient will demonstrate improved maximal gait speed AEB >1.37m/s.    Status New     PT SHORT TERM GOAL #3   Title After 4 weeks patient will demonstrate improved bilateral hip stabilizer function AEB performance of SLS>20sec bilat.    Status New     PT SHORT TERM GOAL #4   Title After 4 weeks patient will demonstrate improved lumbar spine stabilization AEB 3x10 chair squats with 25lb loading without exaceberation of low back pain or BLE symptoms.    Status New           PT Long Term Goals - 06/27/17 1530      PT LONG TERM GOAL #1   Title After 8 weeks patient will demonstrate improved tolerance to functional activity, AMB >1612ft without onset of LE symptoms.    Status New     PT LONG TERM  GOAL #2   Title After 8 weeks patient will demonstrate improved maximal gait speed AEB >1.71m/s.    Status New     PT LONG TERM GOAL #3   Title After 8 weeks patient will demonstrate improved bilateral hip stabilizer function AEB performance of SLS>30 sec bilat on Bosu firm side up.    Status New     PT LONG TERM GOAL #4   Title After 8 weeks patient will demonstrate improved lumbar spine stabilization AEB 3x6 chair squats with 40lb loading without exaceberation of low back pain or BLE symptoms.    Status New               Plan - 07/20/17 1610    Clinical Impression Statement Pt presented to therapy with improved reports of symptoms. Session focused on gluteal strengthening and addressing soft tissue restrictions in bil calves. Pt reported no pain at EOS.   Rehab Potential Good   PT Frequency 2x / week   PT Duration 8 weeks   PT Treatment/Interventions ADLs/Self Care Home Management;Patient/family education;Dry needling;Energy conservation;Electrical Stimulation;Gait training;Functional mobility training;Therapeutic activities;Therapeutic exercise;Balance training;Neuromuscular re-education;Passive range of motion;Manual techniques   PT Next Visit Plan At time difficult to tell if 'pulling' sensation is sciatic tension or ham, strings/calf tension so encourgaed the patient to find comfortable range for all exercises to be conservative in this area (particularly with calf stretches of hamstrings stretches), flexion-based activities for pain control/management; continue with gluteal strengthening, improved tolerance to movement; add step ups next session   PT Home Exercise Plan DKTC 3x30sec, DKTC rocking 25x, SKTC in hookling 2x30sec, Supine Marching 2x10---all 3x daily. 9/28: standing calf stretch; 10/5: bridge, sidelying clamshesll with GTB; 10/19: standing HS stretch, sit <> stands, s/l hip abd   Consulted and Agree with Plan of Care Patient      Patient will benefit from  skilled therapeutic intervention in order to improve the following deficits and impairments:  Abnormal gait, Decreased activity tolerance, Decreased range of motion, Decreased strength, Difficulty walking, Hypomobility, Postural dysfunction, Improper body mechanics, Pain  Visit Diagnosis: Other symptoms and signs involving the nervous system  Difficulty in walking, not elsewhere classified  Unsteadiness on feet     Problem List Patient Active Problem List   Diagnosis Date Noted  . Bilateral sciatica 05/21/2017  . Essential hypertension, benign 02/19/2013  . Hyperlipidemia 02/19/2013        Jac Canavan PT, DPT   [facility] [address] [place], [place], [address] Phone: [phone]   Fax:  [phone]  Name: Shane Young MRN: [id number] Date of Birth: [birth year]

## 2017-07-20 NOTE — Patient Instructions (Signed)
  Hamstring stretch with foot on stool  While standing, place foot on stool or elevated surface.  Keep the foot of the leg that is being stretched in an upward facing direction and the hip in a neutral position.  Gently lean forward at the hip while keeping the spine in a neutral position.  Keeping the hip in a neutral position stretches the hamstrings globally while placing the hip in an externally rotated position stretches the medial hamstrings and an internally rotated position stretches the lateral hamstring  Perform with other exercises, 3-5 stretches holding for 30-60 seconds each   SIT TO STAND - NO SUPPORT  Start by scooting close to the front of the chair.  Next, lean forward at your trunk and reach forward with your arms and rise to standing without using your hands to push off from the chair or other object.   Use your arms as a counter-balance by reaching forward when in sitting and lower them as you approach standing.   Perform with other exercises, 2-3 sets of 10 making sure to have even pressure through both feet   HIP ABDUCTION - SIDELYING  While lying on your side, slowly raise up your top leg to the side. Keep your knee straight and maintain your toes pointed forward the entire time. Keep your leg in-line with your body.  The bottom leg can be bent to stabilize your body.  Perform with other exercises, 2-3 sets of 10 each side

## 2017-07-24 ENCOUNTER — Encounter (HOSPITAL_COMMUNITY): Payer: Self-pay

## 2017-07-24 ENCOUNTER — Ambulatory Visit (HOSPITAL_COMMUNITY): Payer: Medicare Other

## 2017-07-24 DIAGNOSIS — R2681 Unsteadiness on feet: Secondary | ICD-10-CM

## 2017-07-24 DIAGNOSIS — R29818 Other symptoms and signs involving the nervous system: Secondary | ICD-10-CM | POA: Diagnosis not present

## 2017-07-24 DIAGNOSIS — R262 Difficulty in walking, not elsewhere classified: Secondary | ICD-10-CM

## 2017-07-24 NOTE — Therapy (Signed)
[facility] [address] [place], [place], [address] Phone: [phone]   Fax:  [phone]  Physical Therapy Treatment  Patient Details  Name: Shane Young MRN: [id number] Date of Birth: 01-23-1946 No Data Recorded  Encounter Date: 07/24/2017      PT End of Session - 07/24/17 0817    Visit Number 8   Number of Visits 16   Date for PT Re-Evaluation 07/27/17   Authorization Type [employer]HC Medicare    Authorization Time Period 06/27/17-08/27/17   Authorization - Visit Number 8   Authorization - Number of Visits 10   PT Start Time 0815   PT Stop Time 0856   PT Time Calculation (min) 41 min   Activity Tolerance Patient tolerated treatment well;Patient limited by fatigue;No increased pain   Behavior During Therapy WFL for tasks assessed/performed      Past Medical History:  Diagnosis Date  . Hyperlipidemia   . Hypertension     Past Surgical History:  Procedure Laterality Date  . APPENDECTOMY    . COLON SURGERY     55 years ago   . COLONOSCOPY N/A 08/17/2014   Procedure: COLONOSCOPY;  Surgeon: Corbin Adeobert M Rourk, MD;  [facility];  Service: Endoscopy;  Laterality: N/A;  8:30 AM    There were no vitals filed for this visit.      Subjective Assessment - 07/24/17 0816    Subjective Pt states that he had his injection yesterday and he feels great. He has not had any leg pain since.    Pertinent History No prior history of back pain or similar symptoms.    Patient Stated Goals return to work    Currently in Pain? No/denies               [facility]/Exercise - 07/24/17 0001      Lumbar Exercises: Stretches   Active Hamstring Stretch 3 reps;30 seconds   Active Hamstring Stretch Limitations BLE standing with foot on 12" box   Prone on Elbows Stretch Limitations slant board stretch 3x30 sec     Lumbar Exercises: Standing   Heel Raises 15 reps   Heel Raises Limitations heeland toe   Shoulder  Adduction Limitations 4-way resisted walking with purple band 4230ft x1RT each; fwd/retro tandem gait 3915ft x2RT each   Other Standing Lumbar Exercises SLS 5x10" each; SLS with vectors x5RT each   Other Standing Lumbar Exercises fwd step ups on 6" x15 reps each; tandem stance on foam and palov press x15 reps     Manual Therapy   Manual Therapy Myofascial release   Manual therapy comments Manual complete separate than rest of tx   Myofascial Release bil calves, L HS              PT Education - 07/24/17 0818    Education provided Yes   Education Details can practice tandem gait at home for improved balance   Person(s) Educated Patient   Methods Explanation   Comprehension Verbalized understanding          PT Short Term Goals - 06/27/17 1529      PT SHORT TERM GOAL #1   Title After 4 weeks patient will demonstrate improved tolerance to functional activity, AMB >46750ft without onset of LE symptoms.    Status New     PT SHORT TERM GOAL #2   Title After 4 weeks patient will demonstrate improved maximal gait speed AEB 10MWT >1.7474m/s.  Status New     PT SHORT TERM GOAL #3   Title After 4 weeks patient will demonstrate improved bilateral hip stabilizer function AEB performance of SLS>20sec bilat.    Status New     PT SHORT TERM GOAL #4   Title After 4 weeks patient will demonstrate improved lumbar spine stabilization AEB 3x10 chair squats with 25lb loading without exaceberation of low back pain or BLE symptoms.    Status New           PT Long Term Goals - 06/27/17 1530      PT LONG TERM GOAL #1   Title After 8 weeks patient will demonstrate improved tolerance to functional activity, AMB >1648ft without onset of LE symptoms.    Status New     PT LONG TERM GOAL #2   Title After 8 weeks patient will demonstrate improved maximal gait speed AEB >1.68m/s.    Status New     PT LONG TERM GOAL #3   Title After 8 weeks patient will demonstrate improved bilateral hip  stabilizer function AEB performance of SLS>30 sec bilat on Bosu firm side up.    Status New     PT LONG TERM GOAL #4   Title After 8 weeks patient will demonstrate improved lumbar spine stabilization AEB 3x6 chair squats with 40lb loading without exaceberation of low back pain or BLE symptoms.    Status New               Plan - 07/24/17 0900    Clinical Impression Statement Pt states that he got a cortisone shot in his back yesterday and he has felt great ever since, reporting that his leg pain has significantly improved. Session focused on improving flexibility and soft tissue restrictions, gluteal strengthening, and balance. Pt tolerated treatment well, just reporting fatigue. The soft tissue restrictions in his calves are continuing to improve nicely as he is reporting decreased pain to palpation; his L HS was noted to be tender to palpation and will continue to f/u with this in future sessions. Pt due for reassessment next visit.   Rehab Potential Good   PT Frequency 2x / week   PT Duration 8 weeks   PT Treatment/Interventions ADLs/Self Care Home Management;Patient/family education;Dry needling;Energy conservation;Electrical Stimulation;Gait training;Functional mobility training;Therapeutic activities;Therapeutic exercise;Balance training;Neuromuscular re-education;Passive range of motion;Manual techniques   PT Next Visit Plan reassess; flexion-based activities for pain control/management; continue with gluteal strengthening, improved tolerance to movement; add step ups next session   PT Home Exercise Plan DKTC 3x30sec, DKTC rocking 25x, SKTC in hookling 2x30sec, Supine Marching 2x10---all 3x daily. 9/28: standing calf stretch; 10/5: bridge, sidelying clamshesll with GTB; 10/19: standing HS stretch, sit <> stands, s/l hip abd   Consulted and Agree with Plan of Care Patient      Patient will benefit from skilled therapeutic intervention in order to improve the following deficits and  impairments:  Abnormal gait, Decreased activity tolerance, Decreased range of motion, Decreased strength, Difficulty walking, Hypomobility, Postural dysfunction, Improper body mechanics, Pain  Visit Diagnosis: Other symptoms and signs involving the nervous system  Difficulty in walking, not elsewhere classified  Unsteadiness on feet     Problem List Patient Active Problem List   Diagnosis Date Noted  . Bilateral sciatica 05/21/2017  . Essential hypertension, benign 02/19/2013  . Hyperlipidemia 02/19/2013        Jac Canavan PT, DPT  [facility] [address] [place], [place], [address] Phone: [phone]  Fax:  [phone]  Name: Shane Young MRN: [id number] Date of Birth: 1946-04-24

## 2017-07-27 ENCOUNTER — Ambulatory Visit (HOSPITAL_COMMUNITY): Payer: Medicare Other

## 2017-07-27 DIAGNOSIS — R2681 Unsteadiness on feet: Secondary | ICD-10-CM

## 2017-07-27 DIAGNOSIS — R29818 Other symptoms and signs involving the nervous system: Secondary | ICD-10-CM | POA: Diagnosis not present

## 2017-07-27 DIAGNOSIS — R262 Difficulty in walking, not elsewhere classified: Secondary | ICD-10-CM

## 2017-07-27 NOTE — Therapy (Signed)
[facility] [address] [place], [place], [address] Phone: [phone]   Fax:  [phone]    I spoke with Becky Sax, PTA, prior to pt's reassessment and have personally reviewed this note. I agree with its findings.   Jac Canavan PT, DPT   Physical Therapy Treatment  Patient Details  Name: Shane Young MRN: [id number] Date of Birth: [birth year] No Data Recorded  Encounter Date: 07/27/2017      PT End of Session - 07/27/17 0820    Visit Number 9   Number of Visits 16   Date for PT Re-Evaluation 08/27/17   Authorization Type UHC Medicare    Authorization Time Period 06/27/17-08/27/17   Authorization - Visit Number 9   Authorization - Number of Visits 10   PT Start Time 0817   PT Stop Time 0902   PT Time Calculation (min) 45 min   Activity Tolerance Patient tolerated treatment well;Patient limited by fatigue;No increased pain   Behavior During Therapy WFL for tasks assessed/performed      Past Medical History:  Diagnosis Date  . Hyperlipidemia   . Hypertension     Past Surgical History:  Procedure Laterality Date  . APPENDECTOMY    . COLON SURGERY     55 years ago   . COLONOSCOPY N/A 08/17/2014   Procedure: COLONOSCOPY;  Surgeon: Corbin Ade, MD;  [facility];  Service: Endoscopy;  Laterality: N/A;  8:30 AM    There were no vitals filed for this visit.      Subjective Assessment - 07/27/17 0810    Subjective Pt stated he could race after a rabbit today, reports he is feeling good since the injection   How long can you sit comfortably? unlimited   How long can you stand comfortably? able to perform all ADL without limitations   How long can you walk comfortably? Reports he could walk an hour without difficulty   Diagnostic tests Xray, MRI neurological    Patient Stated Goals return to work    Currently in Pain? No/denies            [facility] PT Assessment - 07/27/17 0001       Assessment   Medical Diagnosis Lumbar Neurogenic Claudication    Onset Date/Surgical Date --  August 2018   Next MD Visit Maeola Harman, MD   Prior Therapy none     Precautions   Precautions None   Precaution Comments avoid lumbar extension     Sensation   Light Touch Appears Intact     PROM   PROM Assessment Site Lumbar     Strength   Overall Strength Comments Lower quarter is 5/5 bilat     Ambulation/Gait   Ambulation Distance (Feet) 664 Feet    Gait velocity 1.173m/s  was .95 m/s                     [facility] Adult PT Treatment/Exercise - 07/27/17 0001      Lumbar Exercises: Standing   Heel Raises 15 reps   Heel Raises Limitations heeland toe   Functional Squats 10 reps   Lifting From floor;5 reps   Lifting Limitations multimodal cueing to improve form with lifting   Other Standing Lumbar Exercises SLS Lt 16", Rt 31" max of 3;  vector stance 5x 5" holds each   Other Standing Lumbar Exercises fwd step ups on 6" x15 reps each; tandem stance on foam and palov  press x15 reps     Lumbar Exercises: Seated   Other Seated Lumbar Exercises plantarflexion GTB 15x each     Manual Therapy   Manual Therapy Soft tissue mobilization   Manual therapy comments Manual complete separate than rest of tx   Soft tissue mobilization Prone position soft tissue mobilizaiton to Bil calves                  PT Short Term Goals - 07/27/17 0827      PT SHORT TERM GOAL #1   Title After 4 weeks patient will demonstrate improved tolerance to functional activity, AMB >480ft without onset of LE symptoms.    Baseline 07/27/17 664 no onset of symptoms   Status Achieved     PT SHORT TERM GOAL #2   Title After 4 weeks patient will demonstrate improved maximal gait speed AEB >1.1m/s.    Baseline 07/27/17 664 no onset of symptoms     PT SHORT TERM GOAL #3   Title After 4 weeks patient will demonstrate improved bilateral hip stabilizer function AEB  performance of SLS>20sec bilat.    Baseline 10/26: Lt 16", Rt 31"   Status On-going     PT SHORT TERM GOAL #4   Title After 4 weeks patient will demonstrate improved lumbar spine stabilization AEB 3x10 chair squats with 25lb loading without exaceberation of low back pain or BLE symptoms.    Baseline 07/27/2017:  Pt required cueing for form with squats   Status On-going           PT Long Term Goals - 07/27/17 1206      PT LONG TERM GOAL #1   Title After 8 weeks patient will demonstrate improved tolerance to functional activity, AMB >1645ft without onset of LE symptoms.      PT LONG TERM GOAL #2   Title After 8 weeks patient will demonstrate improved maximal gait speed AEB >1.56m/s.      PT LONG TERM GOAL #3   Title After 8 weeks patient will demonstrate improved bilateral hip stabilizer function AEB performance of SLS>30 sec bilat on Bosu firm side up.      PT LONG TERM GOAL #4   Title After 8 weeks patient will demonstrate improved lumbar spine stabilization AEB 3x6 chair squats with 40lb loading without exaceberation of low back pain or BLE symptoms.                Plan - 07/27/17 0909    Clinical Impression Statement Reviewed goals.  Pt progressing well with reports of ability to ambulate for close to an hour with no reports of pain, complete with gait speed of 1.124 m/s.  Pt demonstates significant weakness with gluteal and gastrocnemius with inability to complete heel raises and multimodal cueing to improve form with squat to reduce strain on lumbar region.  Pt given additional HEP to address gastroc weakness to improve gait mechanics.  No reports of pain through session.     Rehab Potential Good   PT Frequency 2x / week   PT Duration 8 weeks   PT Treatment/Interventions ADLs/Self Care Home Management;Patient/family education;Dry needling;Energy conservation;Electrical Stimulation;Gait training;Functional mobility training;Therapeutic activities;Therapeutic  exercise;Balance training;Neuromuscular re-education;Passive range of motion;Manual techniques   PT Next Visit Plan Continue with flexion-based activities for pain control/management; continue with gluteal strengthening, improved tolerance to movement; Next session begin heel raises on power system at angle for full range and review mechanics with squats to address STGs not  met.   PT Home Exercise Plan DKTC 3x30sec, DKTC rocking 25x, SKTC in hookling 2x30sec, Supine Marching 2x10---all 3x daily. 9/28: standing calf stretch; 10/5: bridge, sidelying clamshesll with GTB; 10/19: standing HS stretch, sit <> stands, s/l hip abd; 07/25/17: GTB for plantar flexion and heel raises      Patient will benefit from skilled therapeutic intervention in order to improve the following deficits and impairments:  Abnormal gait, Decreased activity tolerance, Decreased range of motion, Decreased strength, Difficulty walking, Hypomobility, Postural dysfunction, Improper body mechanics, Pain  Visit Diagnosis: Other symptoms and signs involving the nervous system  Difficulty in walking, not elsewhere classified  Unsteadiness on feet       G-Codes - 07-29-2017 1206    Functional Assessment Tool Used (Outpatient Only) Clinical Judgment    Functional Limitation Mobility: Walking and moving around   Mobility: Walking and Moving Around Current Status [phone] At least 40 percent but less than 60 percent impaired, limited or restricted   Mobility: Walking and Moving Around Goal Status [phone]) At least 1 percent but less than 20 percent impaired, limited or restricted      Problem List Patient Active Problem List   Diagnosis Date Noted  . Bilateral sciatica 05/21/2017  . Essential hypertension, benign 02/19/2013  . Hyperlipidemia 02/19/2013   Ihor Austin, [place]; [place]  Aldona Lento 07-29-2017, 12:07 PM  [place] [address] [place],  [place], [address] Phone: [phone]   Fax:  [phone]  Name: FLEMON KELTY MRN: [id number] Date of Birth: [birth year]

## 2017-07-27 NOTE — Patient Instructions (Addendum)
Ankle Plantar Flexion: Long-Sitting (Single Leg)    Loop tubing around foot of straight leg, anchor with one hand. Leg straight, point toes downward. Repeat 15 times per set. Repeat with other leg. Do 2 sets per session.   http://tub.exer.us/215   Copyright  VHI. All rights reserved.   Heel Raise: Bilateral (Standing)    Rise on balls of feet. Repeat 15 times per set. Do 2 sets per session.   http://orth.exer.us/38   Copyright  VHI. All rights reserved.

## 2017-07-31 ENCOUNTER — Encounter (HOSPITAL_COMMUNITY): Payer: Self-pay

## 2017-07-31 ENCOUNTER — Ambulatory Visit (HOSPITAL_COMMUNITY): Payer: Medicare Other

## 2017-07-31 DIAGNOSIS — R29818 Other symptoms and signs involving the nervous system: Secondary | ICD-10-CM | POA: Diagnosis not present

## 2017-07-31 DIAGNOSIS — R2681 Unsteadiness on feet: Secondary | ICD-10-CM

## 2017-07-31 DIAGNOSIS — R262 Difficulty in walking, not elsewhere classified: Secondary | ICD-10-CM

## 2017-07-31 NOTE — Patient Instructions (Signed)
  CHILD POSE - PRAYER STRETCH  While in a crawl position, slowly lower your buttocks towards your feet until a stretch is felt along your back and or buttocks.  Add in with other stretches, 3-5 holding for 30-60 seconds

## 2017-07-31 NOTE — Therapy (Signed)
[place], [place], [address] Phone: [phone]   Fax:  [phone]  Physical Therapy Treatment  Patient Details  Name: Shane Young MRN: [id number] Date of Birth: 09/01/1946 No Data Recorded  Encounter Date: 07/31/2017      PT End of Session - 07/31/17 7673    Visit Number 10   Number of Visits 16   Date for PT Re-Evaluation 08/27/17   Authorization Type UHC Medicare    Authorization Time Period 06/27/17-08/27/17   Authorization - Visit Number 10   Authorization - Number of Visits 10   PT Start Time 0815   PT Stop Time 0856   PT Time Calculation (min) 41 min   Activity Tolerance Patient tolerated treatment well;Patient limited by fatigue;No increased pain   Behavior During Therapy WFL for tasks assessed/performed      Past Medical History:  Diagnosis Date  . Hyperlipidemia   . Hypertension     Past Surgical History:  Procedure Laterality Date  . APPENDECTOMY    . COLON SURGERY     55 years ago   . COLONOSCOPY N/A 08/17/2014   Procedure: COLONOSCOPY;  Surgeon: Daneil Dolin, MD;  [facility];  Service: Endoscopy;  Laterality: N/A;  8:30 AM    There were no vitals filed for this visit.      Subjective Assessment - 07/31/17 0808    Subjective Pt states that he feels good this monring. No pain or sorenss in his calves this morning. He just has a little bit of stiffness in his lower back from mowing the lawn yesterday.    How long can you sit comfortably? unlimited   How long can you stand comfortably? able to perform all ADL without limitations   How long can you walk comfortably? Reports he could walk an hour without difficulty   Diagnostic tests Xray, MRI neurological    Patient Stated Goals return to work    Currently in Pain? No/denies              [facility] Adult PT Treatment/Exercise - 07/31/17 0001      Lumbar Exercises: Stretches   Active Hamstring Stretch 3  reps;30 seconds   Active Hamstring Stretch Limitations BLE standing with foot on 12" box   Single Knee to Chest Stretch 5 reps;10 seconds   Single Knee to Chest Stretch Limitations BLE   Prone on Elbows Stretch Limitations slant board stretch 3x30 sec   ITB Stretch Limitations lateral child's pose 3x10" each     Lumbar Exercises: Standing   Heel Raises 20 reps   Heel Raises Limitations heel and toe   Functional Squats 15 reps   Functional Squats Limitations cues for proper form   Lifting From floor;From waist;10 reps;5 reps   Lifting Weights (lbs) x10 reps lifting 10# floor to waist; x5 reps lifting floor to waist with turn   Other Standing Lumbar Exercises SLS 5x10" each; fwd/retro tandem gait x2RT   Other Standing Lumbar Exercises goblet squats with 10#DB 2x15 reps; deadlifts with 10# DB from 8" step 2x10 reps     Lumbar Exercises: Seated   Sit to Stand 15 reps              PT Education - 07/31/17 0808    Education provided Yes   Education Details continue HEP, add child's pose to HEP   Person(s) Educated Patient   Methods Explanation;Demonstration   Comprehension Verbalized understanding;Returned demonstration  PT Short Term Goals - 07/27/17 0827      PT SHORT TERM GOAL #1   Title After 4 weeks patient will demonstrate improved tolerance to functional activity, AMB >479f without onset of LE symptoms.    Baseline 07/27/17 3MWT 664 no onset of symptoms   Status Achieved     PT SHORT TERM GOAL #2   Title After 4 weeks patient will demonstrate improved maximal gait speed AEB 10MWT >1.181m.    Baseline 07/27/17 3MWT 664 no onset of symptoms     PT SHORT TERM GOAL #3   Title After 4 weeks patient will demonstrate improved bilateral hip stabilizer function AEB performance of SLS>20sec bilat.    Baseline 10/26: Lt 16", Rt 31"   Status On-going     PT SHORT TERM GOAL #4   Title After 4 weeks patient will demonstrate improved lumbar spine stabilization AEB  3x10 chair squats with 25lb loading without exaceberation of low back pain or BLE symptoms.    Baseline 07/27/2017:  Pt required cueing for form with squats   Status On-going           PT Long Term Goals - 07/27/17 1206      PT LONG TERM GOAL #1   Title After 8 weeks patient will demonstrate improved tolerance to functional activity, AMB >160080fithout onset of LE symptoms.      PT LONG TERM GOAL #2   Title After 8 weeks patient will demonstrate improved maximal gait speed AEB 10MWT >1.51m71m     PT LONG TERM GOAL #3   Title After 8 weeks patient will demonstrate improved bilateral hip stabilizer function AEB performance of SLS>30 sec bilat on Bosu firm side up.      PT LONG TERM GOAL #4   Title After 8 weeks patient will demonstrate improved lumbar spine stabilization AEB 3x6 chair squats with 40lb loading without exaceberation of low back pain or BLE symptoms.                Plan - 07/31/17 0844    Clinical Impression Statement Pt presents to therapy with no reports of LBP or calf pain this date. Session focused on gluteal strengthening, lifting mechanics, and balance. Pt required cues for proper squat mechanics as he demo'd increased weight shift over RLE. Assessed soft tissue restrictions of calves and HS and pt reported no tenderness to palpation this date so did not perform MFR. Pt did report increased tightness/soreness in lower back so had pt perform SKTC and then child's pose and pt reported feeling much improved with no tightness or soreness afterwards.    Rehab Potential Good   PT Frequency 2x / week   PT Duration 8 weeks   PT Treatment/Interventions ADLs/Self Care Home Management;Patient/family education;Dry needling;Energy conservation;Electrical Stimulation;Gait training;Functional mobility training;Therapeutic activities;Therapeutic exercise;Balance training;Neuromuscular re-education;Passive range of motion;Manual techniques   PT Next Visit Plan Continue with  flexion-based activities for pain control/management; continue with gluteal strengthening, improved tolerance to movement; Next session begin heel raises on power system at angle for full range and review mechanics with squats to address STGs not met.; add wall squats next visit   PT Home Exercise Plan DKTC 3x30sec, DKTC rocking 25x, SKTC in hookling 2x30sec, Supine Marching 2x10---all 3x daily. 9/28: standing calf stretch; 10/5: bridge, sidelying clamshesll with GTB; 10/19: standing HS stretch, sit <> stands, s/l hip abd; 07/25/17: GTB for plantar flexion and heel raises   Consulted and Agree with Plan of Care Patient  Patient will benefit from skilled therapeutic intervention in order to improve the following deficits and impairments:  Abnormal gait, Decreased activity tolerance, Decreased range of motion, Decreased strength, Difficulty walking, Hypomobility, Postural dysfunction, Improper body mechanics, Pain  Visit Diagnosis: Other symptoms and signs involving the nervous system  Difficulty in walking, not elsewhere classified  Unsteadiness on feet     Problem List Patient Active Problem List   Diagnosis Date Noted  . Bilateral sciatica 05/21/2017  . Essential hypertension, benign 02/19/2013  . Hyperlipidemia 02/19/2013      Geraldine Solar PT, DPT  [place] [address] [place], [place], [address] Phone: [phone]   Fax:  [phone]  Name: Shane Young MRN: [id number] Date of Birth: [birth year]

## 2017-08-03 ENCOUNTER — Encounter (HOSPITAL_COMMUNITY): Payer: Self-pay

## 2017-08-03 ENCOUNTER — Ambulatory Visit (HOSPITAL_COMMUNITY): Payer: Medicare Other | Attending: Neurosurgery

## 2017-08-03 DIAGNOSIS — R2681 Unsteadiness on feet: Secondary | ICD-10-CM | POA: Diagnosis present

## 2017-08-03 DIAGNOSIS — R29818 Other symptoms and signs involving the nervous system: Secondary | ICD-10-CM | POA: Diagnosis present

## 2017-08-03 DIAGNOSIS — R262 Difficulty in walking, not elsewhere classified: Secondary | ICD-10-CM

## 2017-08-03 NOTE — Therapy (Signed)
[place], [place], [address] Phone: [phone]   Fax:  [phone]  Physical Therapy Treatment  Patient Details  Name: Shane Young MRN: [id number] Date of Birth: Jun 13, 1946 No Data Recorded  Encounter Date: 08/03/2017      PT End of Session - 08/03/17 0820    Visit Number 11   Number of Visits 16   Date for PT Re-Evaluation 08/27/17   Authorization Type UHC Medicare    Authorization Time Period 06/27/17-08/27/17   Authorization - Visit Number 11   Authorization - Number of Visits 20   PT Start Time 8416   PT Stop Time 0856   PT Time Calculation (min) 39 min   Activity Tolerance Patient tolerated treatment well;No increased pain   Behavior During Therapy WFL for tasks assessed/performed      Past Medical History:  Diagnosis Date  . Hyperlipidemia   . Hypertension     Past Surgical History:  Procedure Laterality Date  . APPENDECTOMY    . COLON SURGERY     55 years ago   . COLONOSCOPY N/A 08/17/2014   Procedure: COLONOSCOPY;  Surgeon: Daneil Dolin, MD;  [facility];  Service: Endoscopy;  Laterality: N/A;  8:30 AM    There were no vitals filed for this visit.      Subjective Assessment - 08/03/17 0816    Subjective Pt stated he is feeling good today, reports he completed a lot of yard work yesterday for about 5 hours.  No reports of pain today.   Pertinent History No prior history of back pain or similar symptoms.    Patient Stated Goals return to work    Currently in Pain? No/denies                         [facility] Adult PT Treatment/Exercise - 08/03/17 0001      Lumbar Exercises: Stretches   Active Hamstring Stretch 3 reps;30 seconds   Active Hamstring Stretch Limitations BLE standing with foot on 12" box   Prone on Elbows Stretch Limitations slant board stretch 3x30 sec     Lumbar Exercises: Machines for Strengthening   Other Lumbar Machine Exercise  powertower squat then heel raise 15x 5"     Lumbar Exercises: Standing   Heel Raises 20 reps   Heel Raises Limitations heel and toe   Lifting From floor;15 reps  lifting box from floor with 10# in box   Lifting Weights (lbs) lifting box from floor with 10# in box   Other Standing Lumbar Exercises SLS Rt 36", Lt 36"; tandem and sidestep on balance beam; vector stance BLE 5x 5" intermittent HHA   Other Standing Lumbar Exercises goblet squats with 10#DB 2x15 reps; deadlifts with 10# DB from 8" step 2x10 reps                  PT Short Term Goals - 07/27/17 0827      PT SHORT TERM GOAL #1   Title After 4 weeks patient will demonstrate improved tolerance to functional activity, AMB >430f without onset of LE symptoms.    Baseline 07/27/17 3MWT 664 no onset of symptoms   Status Achieved     PT SHORT TERM GOAL #2   Title After 4 weeks patient will demonstrate improved maximal gait speed AEB 10MWT >1.16m.    Baseline 07/27/17 3MWT 664 no onset of symptoms     PT SHORT  TERM GOAL #3   Title After 4 weeks patient will demonstrate improved bilateral hip stabilizer function AEB performance of SLS>20sec bilat.    Baseline 10/26: Lt 16", Rt 31"   Status On-going     PT SHORT TERM GOAL #4   Title After 4 weeks patient will demonstrate improved lumbar spine stabilization AEB 3x10 chair squats with 25lb loading without exaceberation of low back pain or BLE symptoms.    Baseline 07/27/2017:  Pt required cueing for form with squats   Status On-going           PT Long Term Goals - 07/27/17 1206      PT LONG TERM GOAL #1   Title After 8 weeks patient will demonstrate improved tolerance to functional activity, AMB >1698f without onset of LE symptoms.      PT LONG TERM GOAL #2   Title After 8 weeks patient will demonstrate improved maximal gait speed AEB 10MWT >1.273m.      PT LONG TERM GOAL #3   Title After 8 weeks patient will demonstrate improved bilateral hip stabilizer  function AEB performance of SLS>30 sec bilat on Bosu firm side up.      PT LONG TERM GOAL #4   Title After 8 weeks patient will demonstrate improved lumbar spine stabilization AEB 3x6 chair squats with 40lb loading without exaceberation of low back pain or BLE symptoms.                Plan - 08/03/17 0854    Clinical Impression Statement Pt presents to therapy with a big grin stateing he is feeling good today and able to complete 5 hours of yard work yesterday.  Session focus on gluteal and gastroc strengthening, lifting mechancs and balance training.  Pt improving form wiht functional squats wtih minimal cueing required initially, pt able to verbalize and demonstrate proper form.  Added wall slides and cybex machine for gluteal and gastroc strengthening.  Progressed to dynamic surface and vector stance for hip stability and balance training with min A for safety.  No reports of pain through session.   Rehab Potential Good   PT Frequency 2x / week   PT Duration 8 weeks   PT Treatment/Interventions ADLs/Self Care Home Management;Patient/family education;Dry needling;Energy conservation;Electrical Stimulation;Gait training;Functional mobility training;Therapeutic activities;Therapeutic exercise;Balance training;Neuromuscular re-education;Passive range of motion;Manual techniques   PT Next Visit Plan Continue with flexion-based activities for pain control/management; continue with gluteal strengthening, improved tolerance to movement; continue to review mechanc8is with squats to address goals not met.     PT Home Exercise Plan DKTC 3x30sec, DKTC rocking 25x, SKTC in hookling 2x30sec, Supine Marching 2x10---all 3x daily. 9/28: standing calf stretch; 10/5: bridge, sidelying clamshesll with GTB; 10/19: standing HS stretch, sit <> stands, s/l hip abd; 07/25/17: GTB for plantar flexion and heel raises      Patient will benefit from skilled therapeutic intervention in order to improve the following  deficits and impairments:  Abnormal gait, Decreased activity tolerance, Decreased range of motion, Decreased strength, Difficulty walking, Hypomobility, Postural dysfunction, Improper body mechanics, Pain  Visit Diagnosis: Other symptoms and signs involving the nervous system  Difficulty in walking, not elsewhere classified  Unsteadiness on feet     Problem List Patient Active Problem List   Diagnosis Date Noted  . Bilateral sciatica 05/21/2017  . Essential hypertension, benign 02/19/2013  . Hyperlipidemia 02/19/2013   CaIhor AustinLPFerryCBEdgecombeCoAldona Lento1/11/2016, 4:24 PM  [facility]  [address] [place], [place], [address] Phone: [phone]   Fax:  [phone]  Name: Shane Young MRN: [id number] Date of Birth: 05-16-1946

## 2017-08-07 ENCOUNTER — Encounter (HOSPITAL_COMMUNITY): Payer: Self-pay

## 2017-08-07 ENCOUNTER — Ambulatory Visit (HOSPITAL_COMMUNITY): Payer: Medicare Other

## 2017-08-07 DIAGNOSIS — R2681 Unsteadiness on feet: Secondary | ICD-10-CM

## 2017-08-07 DIAGNOSIS — R29818 Other symptoms and signs involving the nervous system: Secondary | ICD-10-CM | POA: Diagnosis not present

## 2017-08-07 DIAGNOSIS — R262 Difficulty in walking, not elsewhere classified: Secondary | ICD-10-CM

## 2017-08-07 NOTE — Therapy (Signed)
[place], [place], [address] Phone: [phone]   Fax:  [phone]  Physical Therapy Treatment  Patient Details  Name: Shane Young MRN: [id number] Date of Birth: 1945/12/06 No Data Recorded  Encounter Date: 08/07/2017  PT End of Session - 08/07/17 0822    Visit Number  12    Number of Visits  16    Date for PT Re-Evaluation  08/27/17    Authorization Type  [employer] Medicare     Authorization Time Period  06/27/17-08/27/17    Authorization - Visit Number  12    Authorization - Number of Visits  20    PT Start Time  2683    PT Stop Time  0856    PT Time Calculation (min)  39 min    Activity Tolerance  Patient tolerated treatment well;No increased pain    Behavior During Therapy  WFL for tasks assessed/performed       Past Medical History:  Diagnosis Date  . Hyperlipidemia   . Hypertension     Past Surgical History:  Procedure Laterality Date  . APPENDECTOMY    . COLON SURGERY     55 years ago     There were no vitals filed for this visit.  Subjective Assessment - 08/07/17 0816    Subjective  Pt stated he is feeling great today, reports increased confidence with stairs at home, able to go up and down wihtout HHA.    Pertinent History  No prior history of back pain or similar symptoms.     Patient Stated Goals  return to work     Currently in Pain?  No/denies                      [facility] Adult PT Treatment/Exercise - 08/07/17 0001      Lumbar Exercises: Standing   Heel Raises  20 reps    Heel Raises Limitations  heel and toe    Functional Squats  15 reps    Functional Squats Limitations  on BOSU    Lifting  From floor;to overhead;15 reps;5 seconds squat with yellow weight ball to 12in then heel raise overhe   squat with yellow weight ball to 12in then heel raise overhe   Lifting Weights (lbs)  lifting yellow ball from 12in then overhead with heel raise    Other Standing Lumbar  Exercises  SLS Lt 37", Rt 25", vector stance 5x5", tandem and sidestep on balance beam; pavot tandemstance on foam 4x 15    Other Standing Lumbar Exercises  goblet squats with 10#DB 2x15 reps; proper lifting 28# with good mechanics wiith GTB               PT Short Term Goals - 07/27/17 0827      PT SHORT TERM GOAL #1   Title  After 4 weeks patient will demonstrate improved tolerance to functional activity, AMB >417f without onset of LE symptoms.     Baseline  07/27/17 3MWT 664 no onset of symptoms    Status  Achieved      PT SHORT TERM GOAL #2   Title  After 4 weeks patient will demonstrate improved maximal gait speed AEB 10MWT >1.126m.     Baseline  07/27/17 3MWT 664 no onset of symptoms      PT SHORT TERM GOAL #3   Title  After 4 weeks patient will demonstrate improved bilateral hip stabilizer function AEB performance of  SLS>20sec bilat.     Baseline  10/26: Lt 16", Rt 31"    Status  On-going      PT SHORT TERM GOAL #4   Title  After 4 weeks patient will demonstrate improved lumbar spine stabilization AEB 3x10 chair squats with 25lb loading without exaceberation of low back pain or BLE symptoms.     Baseline  07/27/2017:  Pt required cueing for form with squats    Status  On-going        PT Long Term Goals - 07/27/17 1206      PT LONG TERM GOAL #1   Title  After 8 weeks patient will demonstrate improved tolerance to functional activity, AMB >162f without onset of LE symptoms.       PT LONG TERM GOAL #2   Title  After 8 weeks patient will demonstrate improved maximal gait speed AEB 10MWT >1.253m.       PT LONG TERM GOAL #3   Title  After 8 weeks patient will demonstrate improved bilateral hip stabilizer function AEB performance of SLS>30 sec bilat on Bosu firm side up.       PT LONG TERM GOAL #4   Title  After 8 weeks patient will demonstrate improved lumbar spine stabilization AEB 3x6 chair squats with 40lb loading without exaceberation of low back pain or BLE  symptoms.             Plan - 08/07/17 0854    Clinical Impression Statement  Pt progressing well towards goals.  Pt arrived wiht big grin on face stated he carried 5gallons to the cows this weekend and completed stairs at home with no HHA required up and down.  Session focus on gluteal and gastroc strengthening, balance training and reviewed proper lifting mechanics.  Pt able to demonstrate improved mechanics with squats with no cueing required this session.  Added BOSU dynamic balance activites with balance and functional strengthening activities.  No reports of pain through session.    Rehab Potential  Good    PT Frequency  2x / week    PT Duration  8 weeks    PT Treatment/Interventions  ADLs/Self Care Home Management;Patient/family education;Dry needling;Energy conservation;Electrical Stimulation;Gait training;Functional mobility training;Therapeutic activities;Therapeutic exercise;Balance training;Neuromuscular re-education;Passive range of motion;Manual techniques    PT Next Visit Plan  Potentially reassess next session.  Continue with flexion-based activities for pain control/management; continue with gluteal strengthening, improved tolerance to movement; continue to review mechanc8is with squats to address goals not met.      PT Home Exercise Plan  DKTC 3x30sec, DKTC rocking 25x, SKTC in hookling 2x30sec, Supine Marching 2x10---all 3x daily. 9/28: standing calf stretch; 10/5: bridge, sidelying clamshesll with GTB; 10/19: standing HS stretch, sit <> stands, s/l hip abd; 07/25/17: GTB for plantar flexion and heel raises       Patient will benefit from skilled therapeutic intervention in order to improve the following deficits and impairments:  Abnormal gait, Decreased activity tolerance, Decreased range of motion, Decreased strength, Difficulty walking, Hypomobility, Postural dysfunction, Improper body mechanics, Pain  Visit Diagnosis: Other symptoms and signs involving the nervous  system  Unsteadiness on feet  Difficulty in walking, not elsewhere classified     Problem List Patient Active Problem List   Diagnosis Date Noted  . Bilateral sciatica 05/21/2017  . Essential hypertension, benign 02/19/2013  . Hyperlipidemia 02/19/2013   [facility] [place] Lento1/03/2017, 9:09 AM  [place]  [place], [place], [address] Phone: [phone]   Fax:  [phone]  Name: Shane Young MRN: [id number] Date of Birth: [birth year]

## 2017-08-10 ENCOUNTER — Encounter (HOSPITAL_COMMUNITY): Payer: Self-pay

## 2017-08-10 ENCOUNTER — Ambulatory Visit (HOSPITAL_COMMUNITY): Payer: Medicare Other

## 2017-08-10 DIAGNOSIS — R29818 Other symptoms and signs involving the nervous system: Secondary | ICD-10-CM

## 2017-08-10 DIAGNOSIS — R2681 Unsteadiness on feet: Secondary | ICD-10-CM

## 2017-08-10 DIAGNOSIS — R262 Difficulty in walking, not elsewhere classified: Secondary | ICD-10-CM

## 2017-08-10 NOTE — Therapy (Signed)
[place], [place], [address] Phone: [phone]   Fax:  [phone]  Physical Therapy Treatment/Discharge Summary  Patient Details  Name: Shane Young MRN: [id number] Date of Birth: [birth year] No Data Recorded  Encounter Date: 08/10/2017  PT End of Session - 08/10/17 0819    Visit Number  13    Number of Visits  16    Date for PT Re-Evaluation  08/27/17    Authorization Type  [employer]HC Medicare     Authorization Time Period  06/27/17-08/27/17    Authorization - Visit Number  13    Authorization - Number of Visits  20    PT Start Time  0815    PT Stop Time  0848    PT Time Calculation (min)  33 min    Activity Tolerance  Patient tolerated treatment well;No increased pain    Behavior During Therapy  WFL for tasks assessed/performed       Past Medical History:  Diagnosis Date  . Hyperlipidemia   . Hypertension     Past Surgical History:  Procedure Laterality Date  . APPENDECTOMY    . COLON SURGERY     55 years ago     There were no vitals filed for this visit.  Subjective Assessment - 08/10/17 0818    Subjective  Pt states that he is a little sore and stiff after using his backpack blower yesterday. 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     Pertinent History  No prior history of back pain or similar symptoms.     Patient Stated Goals  return to work     Currently in Pain?  No/denies         [facility] PT Assessment - 08/10/17 0001      Observation/Other Assessments   Observations  Lifting 25# 3x10 reps: no LBP or BLE symptoms; Lifting 40# 3x6 reps: no LBP or BLE symptoms      Ambulation/Gait   Ambulation Distance (Feet)  1808 Feet    Gait velocity  1.76ms    Gait Comments  no increases in low back or BLE symptoms      Balance   Balance Assessed  Yes      Static Standing Balance   Static Standing - Balance Support  No upper extremity supported    Static Standing Balance -  Activities   Single Leg  Stance - Right Leg;Single Leg Stance - Left Leg    Static Standing - Comment/# of Minutes  R: 36 sec, L: 33 sec             PT Short Term Goals - 08/10/17 03735     PT SHORT TERM GOAL #1   Title  After 4 weeks patient will demonstrate improved tolerance to functional activity, AMB >4561fwithout onset of LE symptoms.     Baseline  --    Status  Achieved      PT SHORT TERM GOAL #2   Title  After 4 weeks patient will demonstrate improved maximal gait speed AEB 10MWT >1.1031m     Baseline  --    Status  Achieved      PT SHORT TERM GOAL #3   Title  After 4 weeks patient will demonstrate improved bilateral hip stabilizer function AEB performance of SLS>20sec bilat.     Baseline  --    Status  Achieved      PT SHORT TERM GOAL #4  Title  After 4 weeks patient will demonstrate improved lumbar spine stabilization AEB 3x10 chair squats with 25lb loading without exaceberation of low back pain or BLE symptoms.     Baseline  --    Status  Achieved        PT Long Term Goals - 2017/08/14 0834      PT LONG TERM GOAL #1   Title  After 8 weeks patient will demonstrate improved tolerance to functional activity, AMB >1670f without onset of LE symptoms.     Status  Achieved      PT LONG TERM GOAL #2   Title  After 8 weeks patient will demonstrate improved maximal gait speed AEB 10MWT >1.273m.     Status  Achieved      PT LONG TERM GOAL #3   Title  After 8 weeks patient will demonstrate improved bilateral hip stabilizer function AEB performance of SLS>30 sec bilat on Bosu firm side up.     Status  Achieved      PT LONG TERM GOAL #4   Title  After 8 weeks patient will demonstrate improved lumbar spine stabilization AEB 3x6 chair squats with 40lb loading without exaceberation of low back pain or BLE symptoms.     Status  Achieved            Plan - [id number]    Clinical Impression Statement  Pt presents to therapy with continued reports of improved symptoms and verbalizes  that he would like for today to be his last day. PT reassessed pt's goals and outcome measures and he has made great progress overall as he has met all STG and LTG. He reports feeling 85-90% improved, reporting that his only remaining limitation is concerns about overdoing it and reinjuring himself. At this time due to progress made, pt is ready for discharge. He was encouraged to continue to gradually return to his PLOF and reminded of the importance of proper lifting technique and he verbalized understanding.     Rehab Potential  Good    PT Frequency  2x / week    PT Duration  8 weeks    PT Treatment/Interventions  ADLs/Self Care Home Management;Patient/family education;Dry needling;Energy conservation;Electrical Stimulation;Gait training;Functional mobility training;Therapeutic activities;Therapeutic exercise;Balance training;Neuromuscular re-education;Passive range of motion;Manual techniques    PT Next Visit Plan  discharged    PT Home Exercise Plan  DKTC 3x30sec, DKTC rocking 25x, SKTC in hookling 2x30sec, Supine Marching 2x10---all 3x daily. 9/28: standing calf stretch; 10/5: bridge, sidelying clamshesll with GTB; 10/19: standing HS stretch, sit <> stands, s/l hip abd; 07/25/17: GTB for plantar flexion and heel raises       Patient will benefit from skilled therapeutic intervention in order to improve the following deficits and impairments:  Abnormal gait, Decreased activity tolerance, Decreased range of motion, Decreased strength, Difficulty walking, Hypomobility, Postural dysfunction, Improper body mechanics, Pain  Visit Diagnosis: Other symptoms and signs involving the nervous system  Unsteadiness on feet  Difficulty in walking, not elsewhere classified   G-Codes - [id number]    Functional Assessment Tool Used (Outpatient Only)  Clinical Judgment     Functional Limitation  Mobility: Walking and moving around    Mobility: Walking and Moving Around Goal Status ([id number] At least 1  percent but less than 20 percent impaired, limited or restricted    Mobility: Walking and Moving Around Discharge Status ([id number] At least 1 percent but less than 20 percent impaired, limited or restricted  Problem List Patient Active Problem List   Diagnosis Date Noted  . Bilateral sciatica 05/21/2017  . Essential hypertension, benign 02/19/2013  . Hyperlipidemia 02/19/2013      Geraldine Solar PT, DPT  [place] [address] [place], [place], [address] Phone: [phone]   Fax:  [phone]  Name: Shane Young MRN: [id number] Date of Birth: [birth year]

## 2017-09-05 ENCOUNTER — Other Ambulatory Visit: Payer: Self-pay | Admitting: Family Medicine

## 2017-09-21 ENCOUNTER — Other Ambulatory Visit: Payer: Self-pay | Admitting: Family Medicine

## 2017-11-21 ENCOUNTER — Ambulatory Visit: Payer: Medicare Other | Admitting: Family Medicine

## 2017-11-21 ENCOUNTER — Telehealth: Payer: Self-pay | Admitting: Family Medicine

## 2017-11-21 DIAGNOSIS — E785 Hyperlipidemia, unspecified: Secondary | ICD-10-CM

## 2017-11-21 DIAGNOSIS — I10 Essential (primary) hypertension: Secondary | ICD-10-CM

## 2017-11-21 DIAGNOSIS — Z1159 Encounter for screening for other viral diseases: Secondary | ICD-10-CM

## 2017-11-21 DIAGNOSIS — Z79899 Other long term (current) drug therapy: Secondary | ICD-10-CM

## 2017-11-21 NOTE — Telephone Encounter (Signed)
Left message to return call to let pt know lab orders are ready.

## 2017-11-21 NOTE — Telephone Encounter (Signed)
Pt.notified

## 2017-11-21 NOTE — Telephone Encounter (Signed)
Left message to return call 

## 2017-11-21 NOTE — Telephone Encounter (Signed)
Metabolic 7, lipid, liver, hepatitis C antibody

## 2017-11-21 NOTE — Telephone Encounter (Signed)
Patient has an appointment on 11/26/17 with Dr. Lorin PicketScott. He wants to know if he is due for labs.

## 2017-11-21 NOTE — Telephone Encounter (Signed)
Last labs 05/16/17 psa, bmp, liver, lipid

## 2017-11-24 LAB — LIPID PANEL
CHOLESTEROL TOTAL: 120 mg/dL (ref 100–199)
Chol/HDL Ratio: 2.5 ratio (ref 0.0–5.0)
HDL: 48 mg/dL (ref >39)
LDL CALC: 59 mg/dL (ref 0–99)
Triglycerides: 67 mg/dL (ref 0–149)
VLDL Cholesterol Cal: 13 mg/dL (ref 5–40)

## 2017-11-24 LAB — HEPATIC FUNCTION PANEL
ALBUMIN: 4.6 g/dL (ref 3.5–4.8)
ALT: 26 IU/L (ref 0–44)
AST: 20 IU/L (ref 0–40)
Alkaline Phosphatase: 68 IU/L (ref 39–117)
BILIRUBIN TOTAL: 0.4 mg/dL (ref 0.0–1.2)
Bilirubin, Direct: 0.12 mg/dL (ref 0.00–0.40)
Total Protein: 7 g/dL (ref 6.0–8.5)

## 2017-11-24 LAB — BASIC METABOLIC PANEL
BUN/Creatinine Ratio: 12 (ref 10–24)
BUN: 11 mg/dL (ref 8–27)
CO2: 20 mmol/L (ref 20–29)
Calcium: 9.3 mg/dL (ref 8.6–10.2)
Chloride: 105 mmol/L (ref 96–106)
Creatinine, Ser: 0.94 mg/dL (ref 0.76–1.27)
GFR, EST AFRICAN AMERICAN: 95 mL/min/{1.73_m2} (ref >59)
GFR, EST NON AFRICAN AMERICAN: 82 mL/min/{1.73_m2} (ref >59)
Glucose: 119 mg/dL — ABNORMAL HIGH (ref 65–99)
POTASSIUM: 4.5 mmol/L (ref 3.5–5.2)
Sodium: 141 mmol/L (ref 134–144)

## 2017-11-24 LAB — HEPATITIS C ANTIBODY: Hep C Virus Ab: <0.1 s/co ratio (ref 0.0–0.9)

## 2017-11-26 ENCOUNTER — Encounter: Payer: Self-pay | Admitting: Family Medicine

## 2017-11-26 ENCOUNTER — Ambulatory Visit: Payer: Medicare Other | Admitting: Family Medicine

## 2017-11-26 VITALS — BP 118/72 | Ht 68.25 in | Wt 183.0 lb

## 2017-11-26 DIAGNOSIS — I10 Essential (primary) hypertension: Secondary | ICD-10-CM | POA: Diagnosis not present

## 2017-11-26 DIAGNOSIS — R7301 Impaired fasting glucose: Secondary | ICD-10-CM

## 2017-11-26 DIAGNOSIS — E785 Hyperlipidemia, unspecified: Secondary | ICD-10-CM | POA: Diagnosis not present

## 2017-11-26 LAB — POCT GLYCOSYLATED HEMOGLOBIN (HGB A1C): Hemoglobin A1C: 4.9

## 2017-11-26 MED ORDER — LISINOPRIL 5 MG PO TABS: 5.0000 mg | ORAL_TABLET | Freq: Every day | ORAL | 1 refills | Status: DC

## 2017-11-26 MED ORDER — ZOSTER VAC RECOMB ADJUVANTED 50 MCG/0.5ML IM SUSR: 0.5000 mL | Freq: Once | INTRAMUSCULAR | 0 refills | Status: AC

## 2017-11-26 MED ORDER — ATORVASTATIN CALCIUM 20 MG PO TABS: 20.0000 mg | ORAL_TABLET | Freq: Every day | ORAL | 1 refills | Status: DC

## 2017-11-26 NOTE — Progress Notes (Signed)
Subjective:    Patient ID: Shane Young, male    DOB: 1946/06/16, 72 y.o.   MRN: [id number]   Results for orders placed or performed in visit on 11/26/17 -POCT glycosylated hemoglobin (Hb A1C)      Result                      Value             Ref Range          Hemoglobin A1C              4.9                               Hyperlipidemia  This is a chronic problem. Pertinent negatives include no chest pain or shortness of breath. Treatments tried: atorvastatin. Compliance problems: works everyday on farm, eats healthy, takes meds every day.    Pt states no concerns today.  Patient takes his medicine as prescribed Tries to watch his diet but does consume a fair amount of carbohydrates  Review of Systems  Constitutional: Negative for activity change, appetite change and fatigue.  HENT: Negative for congestion and rhinorrhea.   Respiratory: Negative for cough, chest tightness and shortness of breath.   Cardiovascular: Negative for chest pain and leg swelling.  Gastrointestinal: Negative for abdominal pain, diarrhea and nausea.  Endocrine: Negative for polydipsia and polyphagia.  Genitourinary: Negative for dysuria and hematuria.  Neurological: Negative for weakness and headaches.  Psychiatric/Behavioral: Negative for confusion and dysphoric mood.       Objective:   Physical Exam  Constitutional: He appears well-nourished. No distress.  HENT:  Head: Normocephalic and atraumatic.  Eyes: Right eye exhibits no discharge. Left eye exhibits no discharge.  Neck: No tracheal deviation present.  Cardiovascular: Normal rate, regular rhythm and normal heart sounds.  No murmur heard. Pulmonary/Chest: Effort normal and breath sounds normal. No respiratory distress. He has no wheezes.  Musculoskeletal: He exhibits no edema.  Lymphadenopathy:    He has no cervical adenopathy.  Neurological: He is alert.  Skin: Skin is warm. No rash noted.  Psychiatric: His behavior is normal.    Vitals reviewed.  Results for orders placed or performed in visit on 11/26/17  POCT glycosylated hemoglobin (Hb A1C)  Result Value Ref Range   Hemoglobin A1C 4.9    Recent lab work reviewed including elevated glucose       Assessment & Plan:  The patient was seen today as part of an evaluation regarding hyperlipidemia. Recent lab work has been reviewed with the patient as well as the goals for good cholesterol care. In addition to this medications have been discussed the importance of compliance with diet and medications discussed as well. Patient has been informed of potential side effects of medications in the importance to notify us should any problems occur. Finally the patient is aware that poor control of cholesterol, noncompliance can dramatically increase her risk of heart attack strokes and premature death. The patient will keep regular office visits and the patient does agreed to periodic lab work.  HTN- Patient was seen today as part of a visit regarding hypertension. The importance of healthy diet and regular physical activity was discussed. The importance of compliance with medications discussed. Ideal goal is to keep blood pressure low elevated levels certainly below 140/90 when possible. The patient was counseled that keeping blood pressure under control lessen  his risk of heart attack, stroke, kidney failure, and early death. The importance of regular follow-ups was discussed with the patient. Low-salt diet such as DASH recommended. Regular physical activity was recommended as well. Patient was advised to keep regular follow-ups.  Patient also advised to minimize starches A1c looks good patient will cut back on starches follow-up in approximately 6 months

## 2018-03-11 ENCOUNTER — Other Ambulatory Visit: Payer: Self-pay | Admitting: Family Medicine

## 2018-04-29 ENCOUNTER — Other Ambulatory Visit: Payer: Self-pay | Admitting: Family Medicine

## 2018-05-03 ENCOUNTER — Telehealth: Payer: Self-pay | Admitting: Family Medicine

## 2018-05-03 DIAGNOSIS — Z79899 Other long term (current) drug therapy: Secondary | ICD-10-CM

## 2018-05-03 DIAGNOSIS — E782 Mixed hyperlipidemia: Secondary | ICD-10-CM

## 2018-05-03 DIAGNOSIS — Z125 Encounter for screening for malignant neoplasm of prostate: Secondary | ICD-10-CM

## 2018-05-03 DIAGNOSIS — I10 Essential (primary) hypertension: Secondary | ICD-10-CM

## 2018-05-03 NOTE — Telephone Encounter (Signed)
Lipid, liver, metabolic 7, PSA 

## 2018-05-03 NOTE — Telephone Encounter (Signed)
Last labs from 11/23/17 Hep C BMP Hepatic and Lipid

## 2018-05-03 NOTE — Telephone Encounter (Signed)
Patient has an appointment on 05/08/18 with Dr. Scott.  He is requesting orders for labs. °

## 2018-05-03 NOTE — Telephone Encounter (Signed)
Patient is aware 

## 2018-05-07 LAB — HEPATIC FUNCTION PANEL
ALT: 23 IU/L (ref 0–44)
AST: 22 IU/L (ref 0–40)
Albumin: 4.6 g/dL (ref 3.5–4.8)
Alkaline Phosphatase: 63 IU/L (ref 39–117)
BILIRUBIN, DIRECT: 0.09 mg/dL (ref 0.00–0.40)
Bilirubin Total: 0.3 mg/dL (ref 0.0–1.2)
Total Protein: 6.9 g/dL (ref 6.0–8.5)

## 2018-05-07 LAB — PSA: PROSTATE SPECIFIC AG, SERUM: 2.1 ng/mL (ref 0.0–4.0)

## 2018-05-07 LAB — BASIC METABOLIC PANEL
BUN / CREAT RATIO: 16 (ref 10–24)
BUN: 17 mg/dL (ref 8–27)
CHLORIDE: 102 mmol/L (ref 96–106)
CO2: 20 mmol/L (ref 20–29)
Calcium: 9.3 mg/dL (ref 8.6–10.2)
Creatinine, Ser: 1.05 mg/dL (ref 0.76–1.27)
GFR calc Af Amer: 82 mL/min/{1.73_m2} (ref >59)
GFR calc non Af Amer: 71 mL/min/{1.73_m2} (ref >59)
GLUCOSE: 99 mg/dL (ref 65–99)
Potassium: 5.2 mmol/L (ref 3.5–5.2)
Sodium: 137 mmol/L (ref 134–144)

## 2018-05-07 LAB — LIPID PANEL
Chol/HDL Ratio: 2.9 ratio (ref 0.0–5.0)
Cholesterol, Total: 123 mg/dL (ref 100–199)
HDL: 42 mg/dL (ref >39)
LDL CALC: 66 mg/dL (ref 0–99)
TRIGLYCERIDES: 76 mg/dL (ref 0–149)
VLDL CHOLESTEROL CAL: 15 mg/dL (ref 5–40)

## 2018-05-08 ENCOUNTER — Ambulatory Visit: Payer: Medicare Other | Admitting: Family Medicine

## 2018-05-08 ENCOUNTER — Encounter: Payer: Self-pay | Admitting: Family Medicine

## 2018-05-08 VITALS — BP 132/84 | Ht 68.25 in | Wt 187.2 lb

## 2018-05-08 DIAGNOSIS — E782 Mixed hyperlipidemia: Secondary | ICD-10-CM

## 2018-05-08 DIAGNOSIS — R7301 Impaired fasting glucose: Secondary | ICD-10-CM | POA: Diagnosis not present

## 2018-05-08 DIAGNOSIS — I10 Essential (primary) hypertension: Secondary | ICD-10-CM | POA: Diagnosis not present

## 2018-05-08 DIAGNOSIS — Z0000 Encounter for general adult medical examination without abnormal findings: Secondary | ICD-10-CM

## 2018-05-08 LAB — POCT GLYCOSYLATED HEMOGLOBIN (HGB A1C): HEMOGLOBIN A1C: 5.1 % (ref 4.0–5.6)

## 2018-05-08 MED ORDER — LISINOPRIL 5 MG PO TABS: 5.0000 mg | ORAL_TABLET | Freq: Every day | ORAL | 1 refills | Status: DC

## 2018-05-08 MED ORDER — ATORVASTATIN CALCIUM 20 MG PO TABS: 20.0000 mg | ORAL_TABLET | Freq: Every day | ORAL | 1 refills | Status: DC

## 2018-05-08 NOTE — Progress Notes (Addendum)
Subjective:    Patient ID: Shane Young, male    DOB: 1946/04/25, 72 y.o.   MRN: [id number]  Hyperlipidemia  This is a chronic problem. Pertinent negatives include no chest pain. There are no compliance problems.   Hypertension  This is a chronic problem. Pertinent negatives include no chest pain, headaches or neck pain. There are no compliance problems.     AWV- Annual Wellness Visit  The patient was seen for their annual wellness visit. The patient's past medical history, surgical history, and family history were reviewed. Pertinent vaccines were reviewed ( tetanus, pneumonia, shingles, flu) The patient's medication list was reviewed and updated.  The height and weight were entered.  BMI recorded in electronic record elsewhere  Cognitive screening was completed. Outcome of Mini - Cog: Passes   Falls /depression screening electronically recorded within record elsewhere  Current tobacco usage: Does not smoke (All patients who use tobacco were given written and verbal information on quitting)  Recent listing of emergency department/hospitalizations over the past year were reviewed.  current specialist the patient sees on a regular basis: No specialist   Medicare annual wellness visit patient questionnaire was reviewed.  A written screening schedule for the patient for the next 5-10 years was given. Appropriate discussion of followup regarding next visit was discussed.  Labs reviewed with the patient patient does good job taking medications  Patient for blood pressure check up.  The patient does have hypertension.  The patient is on medication.  Patient relates compliance with meds. Todays BP reviewed with the patient. Patient denies issues with medication. Patient relates reasonable diet. Patient tries to minimize salt. Patient aware of BP goals.  Patient here for follow-up regarding cholesterol.  The patient does have hyperlipidemia.  Patient does try to maintain a  reasonable diet.  Patient does take the medication on a regular basis.  Denies missing a dose.  The patient denies any obvious side effects.  Prior blood work results reviewed with the patient.  The patient is aware of his cholesterol goals and the need to keep it under good control to lessen the risk of disease.    Results for orders placed or performed in visit on 05/08/18  POCT HgB A1C  Result Value Ref Range   Hemoglobin A1C 5.1 4.0 - 5.6 %   HbA1c POC (<> result, manual entry)  4.0 - 5.6 %   HbA1c, POC (prediabetic range)  5.7 - 6.4 %   HbA1c, POC (controlled diabetic range)  0.0 - 7.0 %     Review of Systems  Constitutional: Negative for activity change, appetite change and fever.  HENT: Negative for congestion and rhinorrhea.   Eyes: Negative for discharge.  Respiratory: Negative for cough and wheezing.   Cardiovascular: Negative for chest pain.  Gastrointestinal: Negative for abdominal pain, blood in stool and vomiting.  Genitourinary: Negative for difficulty urinating and frequency.  Musculoskeletal: Negative for neck pain.  Skin: Negative for rash.  Allergic/Immunologic: Negative for environmental allergies and food allergies.  Neurological: Negative for weakness and headaches.  Psychiatric/Behavioral: Negative for agitation.       Objective:   Physical Exam  Constitutional: He appears well-developed and well-nourished.  HENT:  Head: Normocephalic and atraumatic.  Right Ear: External ear normal.  Left Ear: External ear normal.  Nose: Nose normal.  Mouth/Throat: Oropharynx is clear and moist.  Eyes: Pupils are equal, round, and reactive to light. EOM are normal.  Neck: Normal range of motion. Neck supple. No thyromegaly present.  Cardiovascular: Normal rate, regular rhythm and normal heart sounds.  No murmur heard. Pulmonary/Chest: Effort normal and breath sounds normal. No respiratory distress. He has no wheezes.  Abdominal: Soft. Bowel sounds are normal. He  exhibits no distension and no mass. There is no tenderness.  Genitourinary: Penis normal.  Musculoskeletal: Normal range of motion. He exhibits no edema.  Lymphadenopathy:    He has no cervical adenopathy.  Neurological: He is alert. He exhibits normal muscle tone.  Skin: Skin is warm and dry. No erythema.  Psychiatric: He has a normal mood and affect. His behavior is normal. Judgment normal.   Denies being depressed Does suffer with PTSD related to [place] Patient does not want to be on any medication does not want counseling Not suicidal or homicidal       Assessment & Plan:  Adult wellness-complete.wellness physical was conducted today. Importance of diet and exercise were discussed in detail.  In addition to this a discussion regarding safety was also covered. We also reviewed over immunizations and gave recommendations regarding current immunization needed for age.  In addition to this additional areas were also touched on including: Preventative health exams needed:  Colonoscopy 2025  Patient was advised yearly wellness exam  HTN- Patient was seen today as part of a visit regarding hypertension. The importance of healthy diet and regular physical activity was discussed. The importance of compliance with medications discussed.  Ideal goal is to keep blood pressure low elevated levels certainly below 140/90 when possible.  The patient was counseled that keeping blood pressure under control lessen his risk of complications.  The importance of regular follow-ups was discussed with the patient.  Low-salt diet such as DASH recommended.  Regular physical activity was recommended as well.  Patient was advised to keep regular follow-ups.  The patient was seen today as part of an evaluation regarding hyperlipidemia.  Recent lab work has been reviewed with the patient as well as the goals for good cholesterol care.  In addition to this medications have been discussed the importance of  compliance with diet and medications discussed as well.  Finally the patient is aware that poor control of cholesterol, noncompliance can dramatically increase the risk of complications. The patient will keep regular office visits and the patient does agreed to periodic lab work.  Based on current standard of care I recommended stopping 81 mg aspirin

## 2018-09-13 ENCOUNTER — Other Ambulatory Visit: Payer: Self-pay | Admitting: Family Medicine

## 2018-10-18 ENCOUNTER — Other Ambulatory Visit: Payer: Self-pay | Admitting: Family Medicine

## 2018-10-18 ENCOUNTER — Telehealth: Payer: Self-pay | Admitting: Family Medicine

## 2018-10-18 DIAGNOSIS — E782 Mixed hyperlipidemia: Secondary | ICD-10-CM

## 2018-10-18 DIAGNOSIS — I10 Essential (primary) hypertension: Secondary | ICD-10-CM

## 2018-10-18 NOTE — Telephone Encounter (Signed)
Labs ordered and pt wife informed. Pt wife verbalized understanding

## 2018-10-18 NOTE — Telephone Encounter (Signed)
Pt's wife calling to see if Dr. Lorin Picket would like him to have lab work done before his appt on 11/11/18.

## 2018-10-18 NOTE — Telephone Encounter (Signed)
Lipid, metabolic 7-hyperlipidemia hypertension 

## 2018-10-18 NOTE — Telephone Encounter (Signed)
Last had labs drawn on 05/08/2018 A1c,Lipid,hepatic fx,bmet,psa. Same ? Please advise.

## 2018-11-06 LAB — LIPID PANEL
CHOLESTEROL TOTAL: 127 mg/dL (ref 100–199)
Chol/HDL Ratio: 2.6 ratio (ref 0.0–5.0)
HDL: 48 mg/dL (ref >39)
LDL Calculated: 69 mg/dL (ref 0–99)
Triglycerides: 52 mg/dL (ref 0–149)
VLDL CHOLESTEROL CAL: 10 mg/dL (ref 5–40)

## 2018-11-06 LAB — BASIC METABOLIC PANEL
BUN / CREAT RATIO: 14 (ref 10–24)
BUN: 13 mg/dL (ref 8–27)
CALCIUM: 9.2 mg/dL (ref 8.6–10.2)
CHLORIDE: 105 mmol/L (ref 96–106)
CO2: 21 mmol/L (ref 20–29)
CREATININE: 0.93 mg/dL (ref 0.76–1.27)
GFR calc Af Amer: 95 mL/min/{1.73_m2} (ref >59)
GFR calc non Af Amer: 82 mL/min/{1.73_m2} (ref >59)
GLUCOSE: 100 mg/dL — AB (ref 65–99)
POTASSIUM: 4.8 mmol/L (ref 3.5–5.2)
SODIUM: 142 mmol/L (ref 134–144)

## 2018-11-08 ENCOUNTER — Ambulatory Visit: Payer: Medicare Other | Admitting: Family Medicine

## 2018-11-11 ENCOUNTER — Encounter: Payer: Self-pay | Admitting: Family Medicine

## 2018-11-11 ENCOUNTER — Ambulatory Visit: Payer: Medicare Other | Admitting: Family Medicine

## 2018-11-11 VITALS — BP 138/86 | Wt 180.6 lb

## 2018-11-11 DIAGNOSIS — E782 Mixed hyperlipidemia: Secondary | ICD-10-CM | POA: Diagnosis not present

## 2018-11-11 DIAGNOSIS — I10 Essential (primary) hypertension: Secondary | ICD-10-CM

## 2018-11-11 MED ORDER — ATORVASTATIN CALCIUM 20 MG PO TABS: 20.0000 mg | ORAL_TABLET | Freq: Every day | ORAL | 1 refills | Status: DC

## 2018-11-11 MED ORDER — LISINOPRIL 5 MG PO TABS: 5.0000 mg | ORAL_TABLET | Freq: Every day | ORAL | 1 refills | Status: DC

## 2018-11-11 NOTE — Patient Instructions (Signed)
Results for orders placed or performed in visit on 10/18/18  Basic Metabolic Panel (BMET)  Result Value Ref Range   Glucose 100 (H) 65 - 99 mg/dL   BUN 13 8 - 27 mg/dL   Creatinine, Ser 4.250.93 0.76 - 1.27 mg/dL   GFR calc non Af Amer 82 >59 mL/min/1.73   GFR calc Af Amer 95 >59 mL/min/1.73   BUN/Creatinine Ratio 14 10 - 24   Sodium 142 134 - 144 mmol/L   Potassium 4.8 3.5 - 5.2 mmol/L   Chloride 105 96 - 106 mmol/L   CO2 21 20 - 29 mmol/L   Calcium 9.2 8.6 - 10.2 mg/dL  Lipid panel  Result Value Ref Range   Cholesterol, Total 127 100 - 199 mg/dL   Triglycerides 52 0 - 149 mg/dL   HDL 48 >95>39 mg/dL   VLDL Cholesterol Cal 10 5 - 40 mg/dL   LDL Calculated 69 0 - 99 mg/dL   Chol/HDL Ratio 2.6 0.0 - 5.0 ratio

## 2018-11-11 NOTE — Progress Notes (Signed)
Subjective:    Patient ID: Shane Young, male    DOB: 12-Oct-1945, 73 y.o.   MRN: [id number]  Hypertension  This is a chronic problem. Pertinent negatives include no chest pain, headaches or shortness of breath. There are no compliance problems.   Hyperlipidemia  This is a chronic problem. Pertinent negatives include no chest pain or shortness of breath. There are no compliance problems.    Patient for blood pressure check up.  The patient does have hypertension.  The patient is on medication.  Patient relates compliance with meds. Todays BP reviewed with the patient. Patient denies issues with medication. Patient relates reasonable diet. Patient tries to minimize salt. Patient aware of BP goals.  Patient here for follow-up regarding cholesterol.  The patient does have hyperlipidemia.  Patient does try to maintain a reasonable diet.  Patient does take the medication on a regular basis.  Denies missing a dose.  The patient denies any obvious side effects.  Prior blood work results reviewed with the patient.  The patient is aware of his cholesterol goals and the need to keep it under good control to lessen the risk of disease.  Patient states he is trying to eat healthy.  Trying to stay fairly active.  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  Pt here today for 6 month follow up. Pt states he has read his blood work report on [facility].   Review of Systems  Constitutional: Negative for activity change, fatigue and fever.  HENT: Negative for congestion and rhinorrhea.   Respiratory: Negative for cough and shortness of breath.   Cardiovascular: Negative for chest pain and leg swelling.  Gastrointestinal: Negative for abdominal pain, diarrhea and nausea.  Genitourinary: Negative for dysuria and hematuria.  Neurological: Negative for weakness and headaches.  Psychiatric/Behavioral: Negative for agitation and behavioral problems.       Objective:   Physical Exam Vitals signs reviewed.  Constitutional:        General: He is not in acute distress. HENT:     Head: Normocephalic and atraumatic.  Eyes:     General:        Right eye: No discharge.        Left eye: No discharge.  Neck:     Trachea: No tracheal deviation.  Cardiovascular:     Rate and Rhythm: Normal rate and regular rhythm.     Heart sounds: Normal heart sounds. No murmur.  Pulmonary:     Effort: Pulmonary effort is normal. No respiratory distress.     Breath sounds: Normal breath sounds.  Lymphadenopathy:     Cervical: No cervical adenopathy.  Skin:    General: Skin is warm and dry.  Neurological:     Mental Status: He is alert.     Coordination: Coordination normal.  Psychiatric:        Behavior: Behavior normal.           Assessment & Plan:  HTN- Patient was seen today as part of a visit regarding hypertension. The importance of healthy diet and regular physical activity was discussed. The importance of compliance with medications discussed.  Ideal goal is to keep blood pressure low elevated levels certainly below 140/90 when possible.  The patient was counseled that keeping blood pressure under control lessen his risk of complications.  The importance of regular follow-ups was discussed with the patient.  Low-salt diet such as DASH recommended.  Regular physical activity was recommended as well.  Patient was advised to keep regular follow-ups.  The patient was seen today as part of an evaluation regarding hyperlipidemia.  Recent lab work has been reviewed with the patient as well as the goals for good cholesterol care.  In addition to this medications have been discussed the importance of compliance with diet and medications discussed as well.  Finally the patient is aware that poor control of cholesterol, noncompliance can dramatically increase the risk of complications. The patient will keep regular office visits and the patient does agreed to periodic lab work.  I did review over the lab work with the  patient.  Patient will do follow-up office visit in approximately 6 months with lab work before that visit.

## 2019-05-05 ENCOUNTER — Other Ambulatory Visit: Payer: Self-pay | Admitting: Family Medicine

## 2019-05-26 ENCOUNTER — Other Ambulatory Visit: Payer: Self-pay

## 2019-05-26 ENCOUNTER — Encounter: Payer: Self-pay | Admitting: Family Medicine

## 2019-05-26 ENCOUNTER — Ambulatory Visit (INDEPENDENT_AMBULATORY_CARE_PROVIDER_SITE_OTHER): Payer: Medicare Other | Admitting: Family Medicine

## 2019-05-26 VITALS — BP 128/76 | Ht 68.25 in | Wt 175.0 lb

## 2019-05-26 DIAGNOSIS — E782 Mixed hyperlipidemia: Secondary | ICD-10-CM

## 2019-05-26 DIAGNOSIS — Z0000 Encounter for general adult medical examination without abnormal findings: Secondary | ICD-10-CM | POA: Diagnosis not present

## 2019-05-26 DIAGNOSIS — Z79899 Other long term (current) drug therapy: Secondary | ICD-10-CM | POA: Diagnosis not present

## 2019-05-26 DIAGNOSIS — Z125 Encounter for screening for malignant neoplasm of prostate: Secondary | ICD-10-CM

## 2019-05-26 DIAGNOSIS — R252 Cramp and spasm: Secondary | ICD-10-CM | POA: Diagnosis not present

## 2019-05-26 DIAGNOSIS — I10 Essential (primary) hypertension: Secondary | ICD-10-CM | POA: Diagnosis not present

## 2019-05-26 MED ORDER — LISINOPRIL 5 MG PO TABS: 5.0000 mg | ORAL_TABLET | Freq: Every day | ORAL | 1 refills | Status: DC

## 2019-05-26 MED ORDER — ATORVASTATIN CALCIUM 20 MG PO TABS: 20.0000 mg | ORAL_TABLET | Freq: Every day | ORAL | 1 refills | Status: DC

## 2019-05-26 NOTE — Progress Notes (Signed)
Subjective:    Patient ID: Shane Young, male    DOB: 1946/04/18, 73 y.o.   MRN: [id number]  HPI AWV- Annual Wellness Visit  The patient was seen for their annual wellness visit. The patient's past medical history, surgical history, and family history were reviewed. Pertinent vaccines were reviewed ( tetanus, pneumonia, shingles, flu) The patient's medication list was reviewed and updated.  Patient for blood pressure check up.  The patient does have hypertension.  The patient is on medication.  Patient relates compliance with meds. Todays BP reviewed with the patient. Patient denies issues with medication. Patient relates reasonable diet. Patient tries to minimize salt. Patient aware of BP goals. The patient does state that he avoids salt.  He does take his medicine on a regular basis and has not had any difficulties but is due for his blood pressure checkup which is done every 6 months.  Hyperlipidemia he does take his medicine on a regular basis.  Previous labs new labs reviewed.  Tries watch diet.  Denies missing his medicine states he has not had any major setbacks.  Tolerates his medicine well  Has muscle cramps in his legs worse in the evening time especially on hot days does not take any type of Gatorade and does try to do some stretching.  Does take mustard and it does help  The height and weight were entered.  BMI recorded in electronic record elsewhere  Cognitive screening was completed. Outcome of Mini - Cog: pass   Falls /depression screening electronically recorded within record elsewhere  Current tobacco usage: chews tobacco (All patients who use tobacco were given written and verbal information on quitting)  Recent listing of emergency department/hospitalizations over the past year were reviewed.  current specialist the patient sees on a regular basis: none   Medicare annual wellness visit patient questionnaire was reviewed.  A written screening schedule for  the patient for the next 5-10 years was given. Appropriate discussion of followup regarding next visit was discussed.       Review of Systems  Constitutional: Negative for activity change, appetite change and fever.  HENT: Negative for congestion and rhinorrhea.   Eyes: Negative for discharge.  Respiratory: Negative for cough and wheezing.   Cardiovascular: Negative for chest pain.  Gastrointestinal: Negative for abdominal pain, blood in stool and vomiting.  Genitourinary: Negative for difficulty urinating and frequency.  Musculoskeletal: Negative for neck pain.  Skin: Negative for rash.  Allergic/Immunologic: Negative for environmental allergies and food allergies.  Neurological: Negative for weakness and headaches.  Psychiatric/Behavioral: Negative for agitation.       Objective:   Physical Exam Constitutional:      Appearance: He is well-developed.  HENT:     Head: Normocephalic and atraumatic.     Right Ear: External ear normal.     Left Ear: External ear normal.     Nose: Nose normal.  Eyes:     Pupils: Pupils are equal, round, and reactive to light.  Neck:     Musculoskeletal: Normal range of motion and neck supple.     Thyroid: No thyromegaly.  Cardiovascular:     Rate and Rhythm: Normal rate and regular rhythm.     Heart sounds: Normal heart sounds. No murmur.  Pulmonary:     Effort: Pulmonary effort is normal. No respiratory distress.     Breath sounds: Normal breath sounds. No wheezing.  Abdominal:     General: Bowel sounds are normal. There is no distension.  Palpations: Abdomen is soft. There is no mass.     Tenderness: There is no abdominal tenderness.  Genitourinary:    Penis: Normal.   Musculoskeletal: Normal range of motion.  Lymphadenopathy:     Cervical: No cervical adenopathy.  Skin:    General: Skin is warm and dry.     Findings: No erythema.  Neurological:     Mental Status: He is alert.     Motor: No abnormal muscle tone.   Psychiatric:        Behavior: Behavior normal.        Judgment: Judgment normal.    Prostate exam normal        Assessment & Plan:  Adult wellness-complete.wellness physical was conducted today. Importance of diet and exercise were discussed in detail.  In addition to this a discussion regarding safety was also covered. We also reviewed over immunizations and gave recommendations regarding current immunization needed for age.  In addition to this additional areas were also touched on including: Preventative health exams needed:  Colonoscopy up-to-date Next 10/2023 Patient was advised yearly wellness exam  Blood pressure good control continue current medications refills given.  Watch salt diet stay active  Hyperlipidemia previous labs reviewed new labs ordered continue current medication watch diet closely  Muscle cramps stretching as shown may use Gatorade or other type of supplement such as power aid on days that it is really hot sweats a lot  15 minutes was spent with patient today discussing healthcare issues which they came.  More than 50% of this visit-total duration of visit-was spent in counseling and coordination of care.  Please see diagnosis regarding the focus of this coordination and care

## 2019-05-27 LAB — BASIC METABOLIC PANEL
BUN/Creatinine Ratio: 14 (ref 10–24)
BUN: 13 mg/dL (ref 8–27)
CO2: 22 mmol/L (ref 20–29)
Calcium: 9.5 mg/dL (ref 8.6–10.2)
Chloride: 103 mmol/L (ref 96–106)
Creatinine, Ser: 0.9 mg/dL (ref 0.76–1.27)
GFR calc Af Amer: 98 mL/min/{1.73_m2} (ref >59)
GFR calc non Af Amer: 85 mL/min/{1.73_m2} (ref >59)
Glucose: 106 mg/dL — ABNORMAL HIGH (ref 65–99)
Potassium: 4.7 mmol/L (ref 3.5–5.2)
Sodium: 139 mmol/L (ref 134–144)

## 2019-05-27 LAB — LIPID PANEL
Chol/HDL Ratio: 2.7 ratio (ref 0.0–5.0)
Cholesterol, Total: 125 mg/dL (ref 100–199)
HDL: 46 mg/dL (ref >39)
LDL Calculated: 63 mg/dL (ref 0–99)
Triglycerides: 80 mg/dL (ref 0–149)
VLDL Cholesterol Cal: 16 mg/dL (ref 5–40)

## 2019-05-27 LAB — HEPATIC FUNCTION PANEL
ALT: 21 IU/L (ref 0–44)
AST: 20 IU/L (ref 0–40)
Albumin: 4.7 g/dL (ref 3.7–4.7)
Alkaline Phosphatase: 68 IU/L (ref 39–117)
Bilirubin Total: 0.5 mg/dL (ref 0.0–1.2)
Bilirubin, Direct: 0.16 mg/dL (ref 0.00–0.40)
Total Protein: 7.1 g/dL (ref 6.0–8.5)

## 2019-05-27 LAB — PSA: Prostate Specific Ag, Serum: 2.4 ng/mL (ref 0.0–4.0)

## 2019-05-27 LAB — MAGNESIUM: Magnesium: 2 mg/dL (ref 1.6–2.3)

## 2019-05-29 ENCOUNTER — Encounter: Payer: Self-pay | Admitting: Family Medicine

## 2019-06-30 IMAGING — DX DG LUMBAR SPINE COMPLETE 4+V
5 series · 5 of 5 positions shown · non-contrast
Comparison: No recent prior.

CLINICAL DATA: Back pain.  No known injury.

EXAM:
LUMBAR SPINE - COMPLETE 4+ VIEW

[l-spine ap]
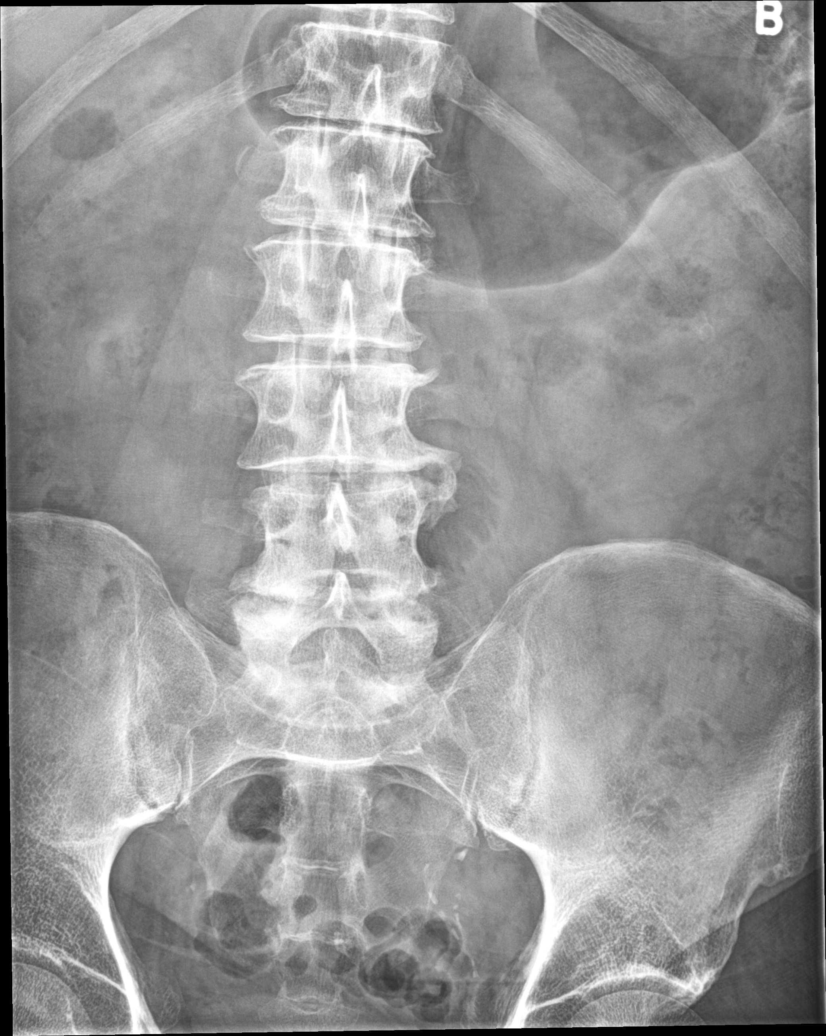

[l-spine obl (1 of 2)]
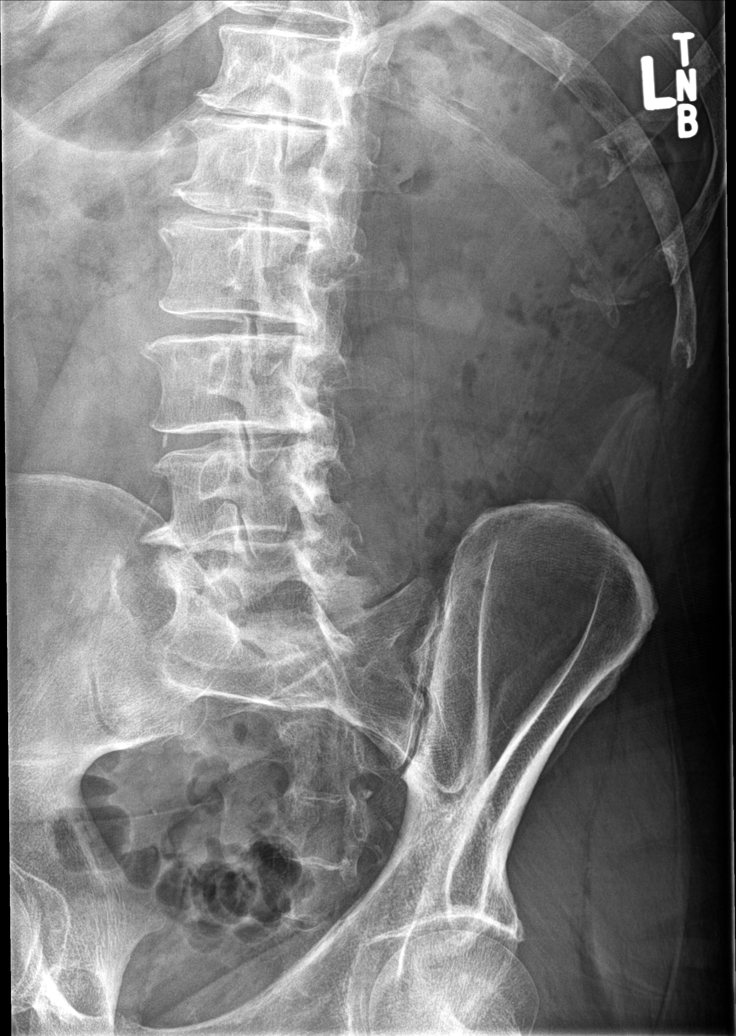

[l-spine obl (2 of 2)]
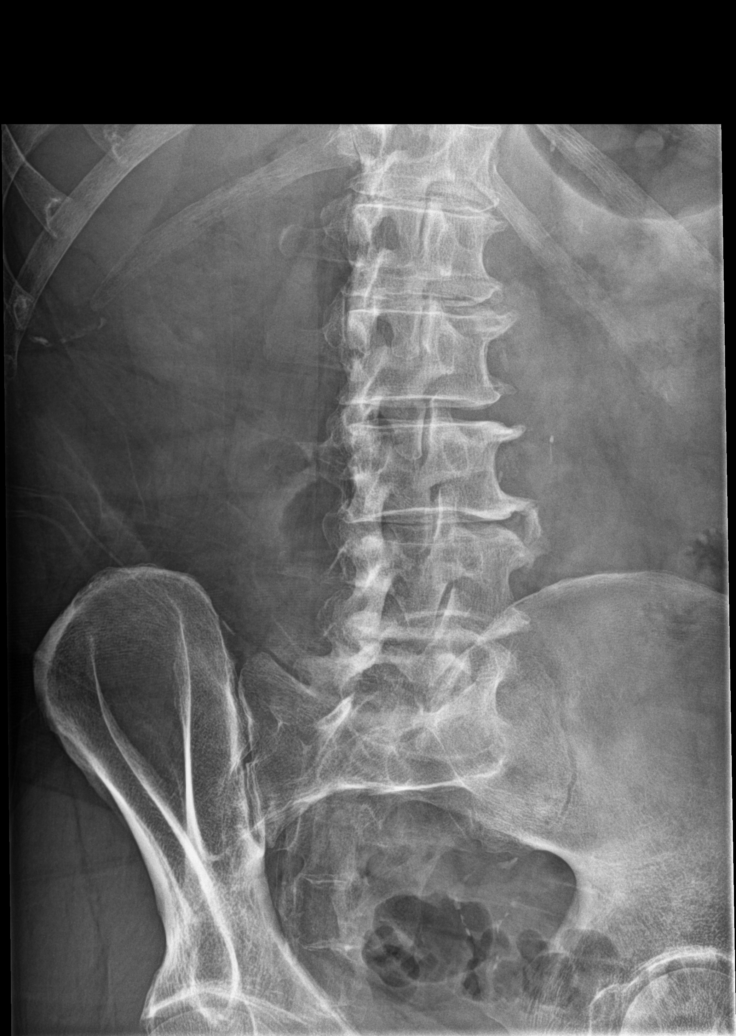

[l-spine lat]
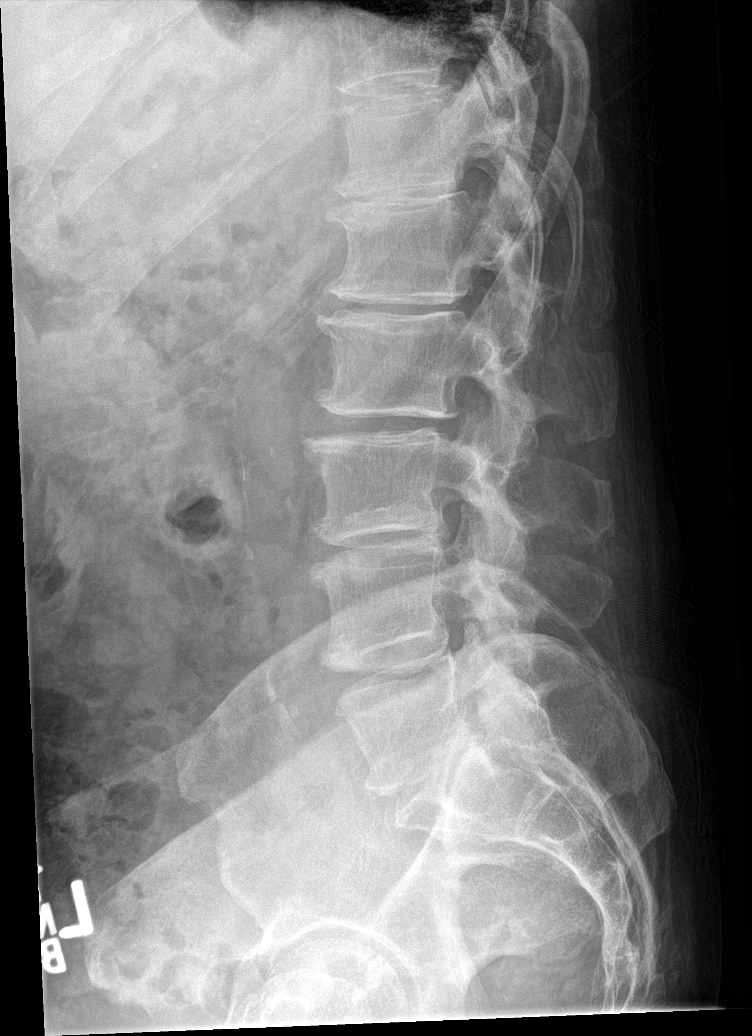

[l-spine spot]
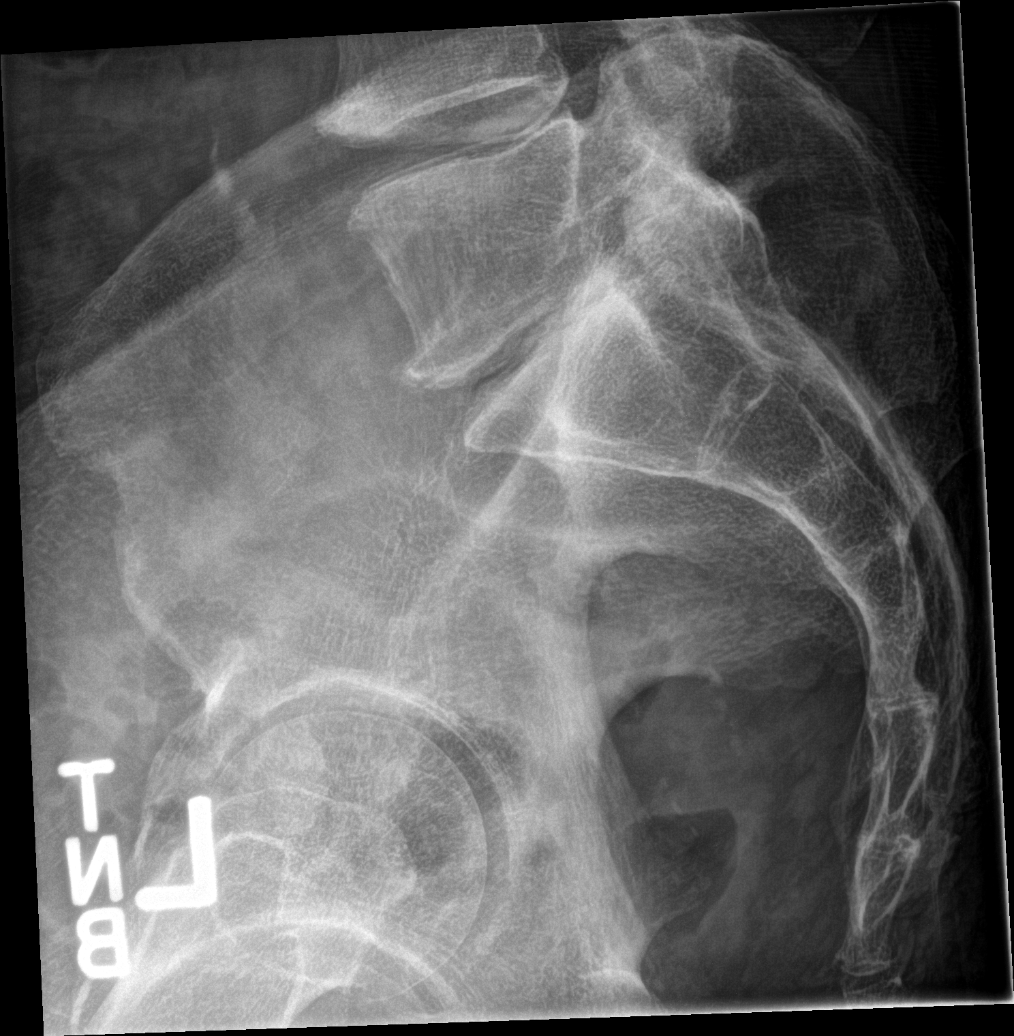

[5 of 5 positions shown; findings below may reference images not displayed]

FINDINGS: Degenerative changes lumbar spine with scoliosis concave left. No
acute bony abnormality identified. Aortoiliac atherosclerotic
vascular calcification.
IMPRESSION: 1.  Degenerative changes lumbar spine with scoliosis concave left.

2. Aortoiliac atherosclerotic vascular disease.

## 2019-09-18 ENCOUNTER — Other Ambulatory Visit: Payer: Self-pay | Admitting: Family Medicine

## 2019-11-10 DIAGNOSIS — X32XXXD Exposure to sunlight, subsequent encounter: Secondary | ICD-10-CM | POA: Diagnosis not present

## 2019-11-10 DIAGNOSIS — L570 Actinic keratosis: Secondary | ICD-10-CM | POA: Diagnosis not present

## 2019-11-10 DIAGNOSIS — C44311 Basal cell carcinoma of skin of nose: Secondary | ICD-10-CM | POA: Diagnosis not present

## 2019-11-26 ENCOUNTER — Ambulatory Visit (INDEPENDENT_AMBULATORY_CARE_PROVIDER_SITE_OTHER): Payer: Medicare PPO | Admitting: Family Medicine

## 2019-11-26 ENCOUNTER — Other Ambulatory Visit: Payer: Self-pay

## 2019-11-26 DIAGNOSIS — K921 Melena: Secondary | ICD-10-CM | POA: Diagnosis not present

## 2019-11-26 DIAGNOSIS — I10 Essential (primary) hypertension: Secondary | ICD-10-CM

## 2019-11-26 DIAGNOSIS — E782 Mixed hyperlipidemia: Secondary | ICD-10-CM

## 2019-11-26 MED ORDER — LISINOPRIL 5 MG PO TABS: 5.0000 mg | ORAL_TABLET | Freq: Every day | ORAL | 1 refills | Status: DC

## 2019-11-26 MED ORDER — ATORVASTATIN CALCIUM 20 MG PO TABS: 20.0000 mg | ORAL_TABLET | Freq: Every day | ORAL | 1 refills | Status: DC

## 2019-11-26 NOTE — Progress Notes (Signed)
   Subjective:    Patient ID: Shane Young, male    DOB: [birth year], 73 y.o.   MRN: [id number]  Hypertension This is a chronic problem. Risk factors for coronary artery disease include male gender (hyperlipidemia). There are no compliance problems (Lisinopril 5 mg daily ).   Pt states that he does not check blood pressure on a regular basis. Mixed hyperlipidemia  Essential hypertension, benign  Blood in stool   Virtual Visit via Telephone Note  I connected with Shane Young on 11/26/19 at  9:00 AM EST by telephone and verified that I am speaking with the correct person using two identifiers.  Location: Patient: home Provider: office   I discussed the limitations, risks, security and privacy concerns of performing an evaluation and management service by telephone and the availability of in person appointments. I also discussed with the patient that there may be a patient responsible charge related to this service. The patient expressed understanding and agreed to proceed.   History of Present Illness:    Observations/Objective:   Assessment and Plan:   Follow Up Instructions:    I discussed the assessment and treatment plan with the patient. The patient was provided an opportunity to ask questions and all were answered. The patient agreed with the plan and demonstrated an understanding of the instructions.   The patient was advised to call back or seek an in-person evaluation if the symptoms worsen or if the condition fails to improve as anticipated.  I provided 20 minutes of non-face-to-face time during this encounter.   States he had one episode with blood in the stool.  To happen after constipation approximately 2 weeks ago has not happened since never had it before up-to-date on colonoscopy He states it was a small amount in the commode when he wiped it was bright red   Review of Systems Denies any chest tightness pressure pain shortness of breath nausea  from diarrhea high fever chills or sweats    Objective:   Physical Exam   Today's visit was via telephone Physical exam was not possible for this visit      Assessment & Plan:  1. Mixed hyperlipidemia Under decent control no need to do labs currently previous labs reviewed new labs will be ordered in the summer  2. Essential hypertension, benign Patient states blood pressure under good control continue current measures watch diet  3. Blood in stool Given he has had some blood in the stool we will do stool test for blood he is up-to-date on colonoscopy we will mail this to him should he have more days he needs needs to let us know and we would set him up with gastroenterology more than likely related to the constipation he was having at the time

## 2019-11-27 NOTE — Progress Notes (Signed)
Stool test kit mailed to pt

## 2019-11-28 ENCOUNTER — Telehealth: Payer: Self-pay | Admitting: Family Medicine

## 2019-11-28 NOTE — Telephone Encounter (Signed)
Called pt to explain to him dr Lorin Picket has a full schedule and may not be able to call and asked if I could send a note back on what he was needing and he said no it was personal and not urgent and dr Lorin Picket could call another day. I told pt that dr Lorin Picket does not usually call people I could schedule him a phone visit and he declined.he states he just needs dr Lorin Picket to call him at his convenience. I told pt I would but he may still need appt.

## 2019-11-28 NOTE — Telephone Encounter (Signed)
Patient is requesting to speak with doctor stating its nothing medical but wants to speak directly to doctor.[phone]

## 2019-12-03 ENCOUNTER — Telehealth: Payer: Self-pay | Admitting: Family Medicine

## 2019-12-03 ENCOUNTER — Encounter: Payer: Self-pay | Admitting: Family Medicine

## 2019-12-03 NOTE — Telephone Encounter (Signed)
Patient requested letter for jury duty.  This is been dictated please print this then I will sign thank you

## 2019-12-03 NOTE — Telephone Encounter (Signed)
Issue was handled he needed a jury duty letter.

## 2019-12-03 NOTE — Telephone Encounter (Signed)
Pt is calling back stating he needs a letter to excuse him from jury duty. He said he is very anxious since he has received the letter. He states he can not handle going to jury duty. Would like a call back.

## 2019-12-03 NOTE — Telephone Encounter (Signed)
Letter was dictated as requested patient will pick up signed copy on Thursday thank you patient aware and will be coming Thursday

## 2019-12-05 ENCOUNTER — Other Ambulatory Visit: Payer: Self-pay | Admitting: Family Medicine

## 2019-12-05 DIAGNOSIS — Z1211 Encounter for screening for malignant neoplasm of colon: Secondary | ICD-10-CM

## 2019-12-05 LAB — IFOBT (OCCULT BLOOD): IFOBT: NEGATIVE

## 2019-12-15 DIAGNOSIS — Z85828 Personal history of other malignant neoplasm of skin: Secondary | ICD-10-CM | POA: Diagnosis not present

## 2019-12-15 DIAGNOSIS — Z08 Encounter for follow-up examination after completed treatment for malignant neoplasm: Secondary | ICD-10-CM | POA: Diagnosis not present

## 2020-03-26 ENCOUNTER — Telehealth: Payer: Self-pay | Admitting: Family Medicine

## 2020-03-26 NOTE — Telephone Encounter (Signed)
Patient  has physical for 8/25 and needing labs done.

## 2020-03-26 NOTE — Telephone Encounter (Signed)
Last labs 8/24: lipid, liver, met 7, psa and magnesium

## 2020-03-28 NOTE — Telephone Encounter (Signed)
Please order the exact same labs that were ordered back in May 26, 2019

## 2020-03-29 ENCOUNTER — Telehealth: Payer: Self-pay | Admitting: Family Medicine

## 2020-03-29 ENCOUNTER — Other Ambulatory Visit: Payer: Self-pay | Admitting: *Deleted

## 2020-03-29 DIAGNOSIS — Z125 Encounter for screening for malignant neoplasm of prostate: Secondary | ICD-10-CM

## 2020-03-29 DIAGNOSIS — R252 Cramp and spasm: Secondary | ICD-10-CM

## 2020-03-29 DIAGNOSIS — E782 Mixed hyperlipidemia: Secondary | ICD-10-CM

## 2020-03-29 DIAGNOSIS — Z79899 Other long term (current) drug therapy: Secondary | ICD-10-CM

## 2020-03-29 DIAGNOSIS — I10 Essential (primary) hypertension: Secondary | ICD-10-CM

## 2020-03-29 NOTE — Telephone Encounter (Signed)
Lmtc. Labs ordered.  °

## 2020-03-29 NOTE — Telephone Encounter (Signed)
Patient returning call.  I informed him that his lab orders had been placed and to go to [facility] fasting prior to his physical appt in August.  Pt understoon.

## 2020-03-29 NOTE — Telephone Encounter (Signed)
Patient notified

## 2020-04-28 ENCOUNTER — Other Ambulatory Visit: Payer: Self-pay | Admitting: Family Medicine

## 2020-05-12 ENCOUNTER — Telehealth: Payer: Self-pay | Admitting: Family Medicine

## 2020-05-12 NOTE — Telephone Encounter (Signed)
Patient notified

## 2020-05-12 NOTE — Telephone Encounter (Signed)
Pt has visit 25th of August needs lab work ordered   Pt call back [phone]

## 2020-05-18 DIAGNOSIS — R252 Cramp and spasm: Secondary | ICD-10-CM | POA: Diagnosis not present

## 2020-05-18 DIAGNOSIS — E782 Mixed hyperlipidemia: Secondary | ICD-10-CM | POA: Diagnosis not present

## 2020-05-18 DIAGNOSIS — Z125 Encounter for screening for malignant neoplasm of prostate: Secondary | ICD-10-CM | POA: Diagnosis not present

## 2020-05-18 DIAGNOSIS — Z79899 Other long term (current) drug therapy: Secondary | ICD-10-CM | POA: Diagnosis not present

## 2020-05-18 DIAGNOSIS — I10 Essential (primary) hypertension: Secondary | ICD-10-CM | POA: Diagnosis not present

## 2020-05-19 LAB — BASIC METABOLIC PANEL
BUN/Creatinine Ratio: 17 (ref 10–24)
BUN: 18 mg/dL (ref 8–27)
CO2: 22 mmol/L (ref 20–29)
Calcium: 9.4 mg/dL (ref 8.6–10.2)
Chloride: 102 mmol/L (ref 96–106)
Creatinine, Ser: 1.08 mg/dL (ref 0.76–1.27)
GFR calc Af Amer: 78 mL/min/{1.73_m2} (ref >59)
GFR calc non Af Amer: 68 mL/min/{1.73_m2} (ref >59)
Glucose: 110 mg/dL — ABNORMAL HIGH (ref 65–99)
Potassium: 4.8 mmol/L (ref 3.5–5.2)
Sodium: 141 mmol/L (ref 134–144)

## 2020-05-19 LAB — MAGNESIUM: Magnesium: 2.1 mg/dL (ref 1.6–2.3)

## 2020-05-19 LAB — PSA: Prostate Specific Ag, Serum: 2.5 ng/mL (ref 0.0–4.0)

## 2020-05-19 LAB — LIPID PANEL
Chol/HDL Ratio: 2.9 ratio (ref 0.0–5.0)
Cholesterol, Total: 137 mg/dL (ref 100–199)
HDL: 48 mg/dL (ref >39)
LDL Chol Calc (NIH): 73 mg/dL (ref 0–99)
Triglycerides: 85 mg/dL (ref 0–149)
VLDL Cholesterol Cal: 16 mg/dL (ref 5–40)

## 2020-05-19 LAB — HEPATIC FUNCTION PANEL
ALT: 15 IU/L (ref 0–44)
AST: 15 IU/L (ref 0–40)
Albumin: 4.6 g/dL (ref 3.7–4.7)
Alkaline Phosphatase: 71 IU/L (ref 48–121)
Bilirubin Total: 0.4 mg/dL (ref 0.0–1.2)
Bilirubin, Direct: 0.14 mg/dL (ref 0.00–0.40)
Total Protein: 7 g/dL (ref 6.0–8.5)

## 2020-05-26 ENCOUNTER — Encounter: Payer: Self-pay | Admitting: Family Medicine

## 2020-05-26 ENCOUNTER — Ambulatory Visit (INDEPENDENT_AMBULATORY_CARE_PROVIDER_SITE_OTHER): Payer: Medicare PPO | Admitting: Family Medicine

## 2020-05-26 ENCOUNTER — Other Ambulatory Visit: Payer: Self-pay

## 2020-05-26 VITALS — BP 124/78 | HR 74 | Temp 97.8°F | Ht 68.25 in | Wt 181.0 lb

## 2020-05-26 DIAGNOSIS — Z0000 Encounter for general adult medical examination without abnormal findings: Secondary | ICD-10-CM

## 2020-05-26 DIAGNOSIS — F411 Generalized anxiety disorder: Secondary | ICD-10-CM | POA: Insufficient documentation

## 2020-05-26 DIAGNOSIS — E782 Mixed hyperlipidemia: Secondary | ICD-10-CM

## 2020-05-26 DIAGNOSIS — I10 Essential (primary) hypertension: Secondary | ICD-10-CM

## 2020-05-26 DIAGNOSIS — F4310 Post-traumatic stress disorder, unspecified: Secondary | ICD-10-CM | POA: Diagnosis not present

## 2020-05-26 MED ORDER — LISINOPRIL 5 MG PO TABS: 5.0000 mg | ORAL_TABLET | Freq: Every day | ORAL | 1 refills | Status: DC

## 2020-05-26 MED ORDER — ATORVASTATIN CALCIUM 20 MG PO TABS: 20.0000 mg | ORAL_TABLET | Freq: Every day | ORAL | 1 refills | Status: DC

## 2020-05-26 MED ORDER — FLUOXETINE HCL 10 MG PO CAPS: 10.0000 mg | ORAL_CAPSULE | Freq: Every day | ORAL | 1 refills | Status: DC

## 2020-05-26 NOTE — Patient Instructions (Addendum)
Thank you for coming for your annual wellness visit.  Please follow through on any advice that was given to you by today's visit. Remember to maintain compliance with your medications as discussed today.  Also remember it is important to eat a healthy diet and to stay physically active on a daily basis.  Please follow through with any testing or recommended followup office visits as was discussed today. You are due the following test coming up:  ALL- Next Colonoscopy-up-to-date next 10/2023            Vaccines-had shingles vaccine has had Covid vaccine         Finally remembered that the annual wellness visit does not take the place of regularly scheduled office visits  chronic health problems such as hypertension/diabetes/cholesterol visits.

## 2020-05-26 NOTE — Progress Notes (Addendum)
Subjective:    Patient ID: Shane Young, male    DOB: [birth year], 74 y.o.   MRN: [id number]  HPI AWV- Annual Wellness Visit  The patient was seen for their annual wellness visit. The patient's past medical history, surgical history, and family history were reviewed. Pertinent vaccines were reviewed ( tetanus, pneumonia, shingles, flu) The patient's medication list was reviewed and updated.  The height and weight were entered.  BMI recorded in electronic record elsewhere  Cognitive screening was completed. Outcome of Mini - Cog: pass   Falls /depression screening electronically recorded within record elsewhere  Current tobacco usage: none (All patients who use tobacco were given written and verbal information on quitting)  Recent listing of emergency department/hospitalizations over the past year were reviewed.  current specialist the patient sees on a regular basis: none   Medicare annual wellness visit patient questionnaire was reviewed.  A written screening schedule for the patient for the next 5-10 years was given. Appropriate discussion of followup regarding next visit was discussed. Results for orders placed or performed in visit on 03/29/20  Basic metabolic panel  Result Value Ref Range   Glucose 110 (H) 65 - 99 mg/dL   BUN 18 8 - 27 mg/dL   Creatinine, Ser 7.93 0.76 - 1.27 mg/dL   GFR calc non Af Amer 68 >59 mL/min/1.73   GFR calc Af Amer 78 >59 mL/min/1.73   BUN/Creatinine Ratio 17 10 - 24   Sodium 141 134 - 144 mmol/L   Potassium 4.8 3.5 - 5.2 mmol/L   Chloride 102 96 - 106 mmol/L   CO2 22 20 - 29 mmol/L   Calcium 9.4 8.6 - 10.2 mg/dL  Lipid panel  Result Value Ref Range   Cholesterol, Total 137 100 - 199 mg/dL   Triglycerides 85 0 - 149 mg/dL   HDL 48 >90 mg/dL   VLDL Cholesterol Cal 16 5 - 40 mg/dL   LDL Chol Calc (NIH) 73 0 - 99 mg/dL   Chol/HDL Ratio 2.9 0.0 - 5.0 ratio  Hepatic function panel  Result Value Ref Range   Total Protein 7.0 6.0 -  8.5 g/dL   Albumin 4.6 3.7 - 4.7 g/dL   Bilirubin Total 0.4 0.0 - 1.2 mg/dL   Bilirubin, Direct 3.00 0.00 - 0.40 mg/dL   Alkaline Phosphatase 71 48 - 121 IU/L   AST 15 0 - 40 IU/L   ALT 15 0 - 44 IU/L  PSA  Result Value Ref Range   Prostate Specific Ag, Serum 2.5 0.0 - 4.0 ng/mL  Magnesium  Result Value Ref Range   Magnesium 2.1 1.6 - 2.3 mg/dL    Glucose slightly elevated patient was encouraged to minimize starches in the diet  The patient is asked to make an attempt to improve diet and exercise patterns to aid in medical management of this problem. Encounter for subsequent annual wellness visit (AWV) in Medicare patient  PTSD (post-traumatic stress disorder)  GAD (generalized anxiety disorder)  Essential hypertension, benign  Mixed hyperlipidemia       Review of Systems  Constitutional: Negative for diaphoresis and fatigue.  HENT: Negative for congestion and rhinorrhea.   Respiratory: Negative for cough and shortness of breath.   Cardiovascular: Negative for chest pain and leg swelling.  Gastrointestinal: Negative for abdominal pain and diarrhea.  Skin: Negative for color change and rash.  Neurological: Negative for dizziness and headaches.  Psychiatric/Behavioral: Negative for behavioral problems and confusion.       Objective:  Physical Exam Vitals reviewed.  Constitutional:      General: He is not in acute distress. HENT:     Head: Normocephalic and atraumatic.  Eyes:     General:        Right eye: No discharge.        Left eye: No discharge.  Neck:     Trachea: No tracheal deviation.  Cardiovascular:     Rate and Rhythm: Normal rate and regular rhythm.     Heart sounds: Normal heart sounds. No murmur heard.   Pulmonary:     Effort: Pulmonary effort is normal. No respiratory distress.     Breath sounds: Normal breath sounds.  Lymphadenopathy:     Cervical: No cervical adenopathy.  Skin:    General: Skin is warm and dry.  Neurological:      Mental Status: He is alert.     Coordination: Coordination normal.  Psychiatric:        Behavior: Behavior normal.           Assessment & Plan:  1. PTSD (post-traumatic stress disorder) Patient with PTSD related to [place].  We discussed this.  Patient would like to try medication to see if it will help him with his anxiety with aspect.  I recommend starting Prozac low-dose.  I would like to do a follow-up visit with him via phone in 4 weeks.  Encourage patient to notify us if he feels the medication is causing any trouble.  Patient not suicidal  2. 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 Essential hypertension, benign Blood pressure good control continue current measures  4. Mixed hyperlipidemia Hyperlipidemia continue medication.  Follow-up if any problems  5. Encounter for subsequent annual wellness visit (AWV) in Medicare patient Adult wellness-complete.wellness physical was conducted today. Importance of diet and exercise were discussed in detail.  In addition to this a discussion regarding safety was also covered. We also reviewed over immunizations and gave recommendations regarding current immunization needed for age.  In addition to this additional areas were also touched on including: Preventative health exams needed:  Colonoscopy up-to-date on colonoscopy  Patient was advised yearly wellness exam  It should be noted that the patient was encouraged to stop doing chewing tobacco.  Is also noted that his gums look good

## 2020-06-18 ENCOUNTER — Telehealth: Payer: Self-pay | Admitting: *Deleted

## 2020-06-18 ENCOUNTER — Other Ambulatory Visit: Payer: Self-pay | Admitting: *Deleted

## 2020-06-18 MED ORDER — FLUOXETINE HCL 10 MG PO CAPS: 10.0000 mg | ORAL_CAPSULE | Freq: Every day | ORAL | 1 refills | Status: DC

## 2020-06-18 NOTE — Telephone Encounter (Signed)
Pt called because he has a follow up on 9/22 to follow up on starting prozac. He states pharm states they never received med. I looked in chart and it was sent in on 8/25 but I went ahead and resent since pharm says they did not receive it. And rescheduled pt for one month out to follow up on med.

## 2020-06-23 ENCOUNTER — Telehealth: Payer: Medicare PPO | Admitting: Family Medicine

## 2020-07-26 ENCOUNTER — Telehealth: Payer: Medicare PPO | Admitting: Family Medicine

## 2020-09-21 DIAGNOSIS — H524 Presbyopia: Secondary | ICD-10-CM | POA: Diagnosis not present

## 2020-09-21 DIAGNOSIS — H40013 Open angle with borderline findings, low risk, bilateral: Secondary | ICD-10-CM | POA: Diagnosis not present

## 2020-09-21 DIAGNOSIS — H25013 Cortical age-related cataract, bilateral: Secondary | ICD-10-CM | POA: Diagnosis not present

## 2020-09-21 DIAGNOSIS — H5203 Hypermetropia, bilateral: Secondary | ICD-10-CM | POA: Diagnosis not present

## 2020-09-21 DIAGNOSIS — H52223 Regular astigmatism, bilateral: Secondary | ICD-10-CM | POA: Diagnosis not present

## 2020-09-21 DIAGNOSIS — H2513 Age-related nuclear cataract, bilateral: Secondary | ICD-10-CM | POA: Diagnosis not present

## 2020-09-23 ENCOUNTER — Other Ambulatory Visit: Payer: Self-pay | Admitting: Family Medicine

## 2020-12-23 DIAGNOSIS — X32XXXD Exposure to sunlight, subsequent encounter: Secondary | ICD-10-CM | POA: Diagnosis not present

## 2020-12-23 DIAGNOSIS — C44319 Basal cell carcinoma of skin of other parts of face: Secondary | ICD-10-CM | POA: Diagnosis not present

## 2020-12-23 DIAGNOSIS — L570 Actinic keratosis: Secondary | ICD-10-CM | POA: Diagnosis not present

## 2021-01-12 ENCOUNTER — Other Ambulatory Visit: Payer: Self-pay | Admitting: Family Medicine

## 2021-01-18 ENCOUNTER — Other Ambulatory Visit: Payer: Self-pay | Admitting: Family Medicine

## 2021-01-18 NOTE — Telephone Encounter (Signed)
Sent mychart message

## 2021-01-20 NOTE — Telephone Encounter (Signed)
Left message to schedule appt

## 2021-01-21 NOTE — Telephone Encounter (Signed)
Scheduled appointment 5/19 for medication followup

## 2021-02-03 DIAGNOSIS — Z85828 Personal history of other malignant neoplasm of skin: Secondary | ICD-10-CM | POA: Diagnosis not present

## 2021-02-03 DIAGNOSIS — L570 Actinic keratosis: Secondary | ICD-10-CM | POA: Diagnosis not present

## 2021-02-03 DIAGNOSIS — X32XXXD Exposure to sunlight, subsequent encounter: Secondary | ICD-10-CM | POA: Diagnosis not present

## 2021-02-03 DIAGNOSIS — Z08 Encounter for follow-up examination after completed treatment for malignant neoplasm: Secondary | ICD-10-CM | POA: Diagnosis not present

## 2021-02-17 ENCOUNTER — Ambulatory Visit: Payer: Medicare PPO | Admitting: Family Medicine

## 2021-02-17 ENCOUNTER — Encounter: Payer: Self-pay | Admitting: Family Medicine

## 2021-02-17 ENCOUNTER — Other Ambulatory Visit: Payer: Self-pay

## 2021-02-17 VITALS — BP 116/70 | HR 67 | Temp 98.1°F | Ht 68.25 in | Wt 175.0 lb

## 2021-02-17 DIAGNOSIS — E782 Mixed hyperlipidemia: Secondary | ICD-10-CM

## 2021-02-17 DIAGNOSIS — I10 Essential (primary) hypertension: Secondary | ICD-10-CM

## 2021-02-17 MED ORDER — ATORVASTATIN CALCIUM 20 MG PO TABS
20.0000 mg | ORAL_TABLET | Freq: Every day | ORAL | 1 refills | Status: DC
Start: 2021-02-17 — End: 2021-08-18

## 2021-02-17 MED ORDER — LISINOPRIL 5 MG PO TABS
5.0000 mg | ORAL_TABLET | Freq: Every day | ORAL | 1 refills | Status: DC
Start: 2021-02-17 — End: 2021-05-23

## 2021-02-17 NOTE — Progress Notes (Signed)
   Subjective:    Patient ID: Shane Young, male    DOB: 10-15-1945, 75 y.o.   MRN: [id number]  Hypertension This is a chronic problem. Treatments tried: lisinopril.  Hyperlipidemia  Very nice patient Takes his medicine on a regular basis Denies any depressed States he is no longer taking Prozac He is taking his cholesterol medicine and his blood pressure medicine Tries watch his diet States he is feeling upbeat Denies any chest pressure.  Shortness of breath    Review of Systems     Objective:   Physical Exam  Lungs clear heart regular no murmurs extremities no edema skin blood pressure      Assessment & Plan:  HTN continue current measures watch diet take medications minimize salt  Minimize fried foods fatty foods continue cholesterol medicine  Check lab work before follow-up visit in September for wellness

## 2021-05-23 ENCOUNTER — Other Ambulatory Visit: Payer: Self-pay | Admitting: Family Medicine

## 2021-05-23 DIAGNOSIS — E782 Mixed hyperlipidemia: Secondary | ICD-10-CM

## 2021-05-23 DIAGNOSIS — I10 Essential (primary) hypertension: Secondary | ICD-10-CM

## 2021-06-23 ENCOUNTER — Telehealth: Payer: Self-pay | Admitting: Family Medicine

## 2021-06-23 NOTE — Telephone Encounter (Signed)
Left message for patient to call back and schedule Medicare Annual Wellness Visit (AWV) in office.   If unable to come into the office for AWV,  please offer to do virtually or by telephone.  Last AWV: 05/26/2020  Please schedule at anytime with [facility].  40 minute appointment  Any questions, please contact me at [phone]

## 2021-06-28 ENCOUNTER — Telehealth: Payer: Self-pay | Admitting: Family Medicine

## 2021-06-28 DIAGNOSIS — Z125 Encounter for screening for malignant neoplasm of prostate: Secondary | ICD-10-CM

## 2021-06-28 DIAGNOSIS — Z0000 Encounter for general adult medical examination without abnormal findings: Secondary | ICD-10-CM

## 2021-06-28 DIAGNOSIS — Z79899 Other long term (current) drug therapy: Secondary | ICD-10-CM

## 2021-06-28 DIAGNOSIS — E782 Mixed hyperlipidemia: Secondary | ICD-10-CM

## 2021-06-28 DIAGNOSIS — I10 Essential (primary) hypertension: Secondary | ICD-10-CM

## 2021-06-28 NOTE — Telephone Encounter (Signed)
Patient has physical in November and needing labs

## 2021-06-28 NOTE — Telephone Encounter (Signed)
Lipid, liver, metabolic 7, CBC, PSA Hypertension hyperlipidemia wellness

## 2021-06-28 NOTE — Telephone Encounter (Signed)
Pt has active labs from 02/17/21 PSA, LIPID, CMP. Please advise. Thank you

## 2021-06-28 NOTE — Telephone Encounter (Signed)
Lab orders placed. Left message to return call  

## 2021-07-06 NOTE — Telephone Encounter (Signed)
Left message to return call 

## 2021-07-07 NOTE — Telephone Encounter (Signed)
Pt returned call and verbalized understanding  

## 2021-08-11 DIAGNOSIS — Z0000 Encounter for general adult medical examination without abnormal findings: Secondary | ICD-10-CM | POA: Diagnosis not present

## 2021-08-11 DIAGNOSIS — I10 Essential (primary) hypertension: Secondary | ICD-10-CM | POA: Diagnosis not present

## 2021-08-11 DIAGNOSIS — Z125 Encounter for screening for malignant neoplasm of prostate: Secondary | ICD-10-CM | POA: Diagnosis not present

## 2021-08-11 DIAGNOSIS — E782 Mixed hyperlipidemia: Secondary | ICD-10-CM | POA: Diagnosis not present

## 2021-08-11 DIAGNOSIS — Z79899 Other long term (current) drug therapy: Secondary | ICD-10-CM | POA: Diagnosis not present

## 2021-08-12 LAB — HEPATIC FUNCTION PANEL
ALT: 17 IU/L (ref 0–44)
AST: 23 IU/L (ref 0–40)
Albumin: 4.4 g/dL (ref 3.7–4.7)
Alkaline Phosphatase: 71 IU/L (ref 44–121)
Bilirubin Total: 0.5 mg/dL (ref 0.0–1.2)
Bilirubin, Direct: 0.16 mg/dL (ref 0.00–0.40)
Total Protein: 6.5 g/dL (ref 6.0–8.5)

## 2021-08-12 LAB — CBC WITH DIFFERENTIAL/PLATELET
Basophils Absolute: 0 10*3/uL (ref 0.0–0.2)
Basos: 1 %
EOS (ABSOLUTE): 0.3 10*3/uL (ref 0.0–0.4)
Eos: 6 %
Hematocrit: 42.2 % (ref 37.5–51.0)
Hemoglobin: 14.6 g/dL (ref 13.0–17.7)
Immature Grans (Abs): 0 10*3/uL (ref 0.0–0.1)
Immature Granulocytes: 0 %
Lymphocytes Absolute: 1.6 10*3/uL (ref 0.7–3.1)
Lymphs: 27 %
MCH: 30.2 pg (ref 26.6–33.0)
MCHC: 34.6 g/dL (ref 31.5–35.7)
MCV: 87 fL (ref 79–97)
Monocytes Absolute: 0.6 10*3/uL (ref 0.1–0.9)
Monocytes: 10 %
Neutrophils Absolute: 3.3 10*3/uL (ref 1.4–7.0)
Neutrophils: 56 %
Platelets: 266 10*3/uL (ref 150–450)
RBC: 4.84 x10E6/uL (ref 4.14–5.80)
RDW: 12.1 % (ref 11.6–15.4)
WBC: 5.9 10*3/uL (ref 3.4–10.8)

## 2021-08-12 LAB — BASIC METABOLIC PANEL
BUN/Creatinine Ratio: 15 (ref 10–24)
BUN: 15 mg/dL (ref 8–27)
CO2: 23 mmol/L (ref 20–29)
Calcium: 9.4 mg/dL (ref 8.6–10.2)
Chloride: 104 mmol/L (ref 96–106)
Creatinine, Ser: 1.01 mg/dL (ref 0.76–1.27)
Glucose: 96 mg/dL (ref 70–99)
Potassium: 4.3 mmol/L (ref 3.5–5.2)
Sodium: 142 mmol/L (ref 134–144)
eGFR: 78 mL/min/{1.73_m2} (ref >59)

## 2021-08-12 LAB — LIPID PANEL
Chol/HDL Ratio: 2.7 ratio (ref 0.0–5.0)
Cholesterol, Total: 126 mg/dL (ref 100–199)
HDL: 46 mg/dL (ref >39)
LDL Chol Calc (NIH): 66 mg/dL (ref 0–99)
Triglycerides: 67 mg/dL (ref 0–149)
VLDL Cholesterol Cal: 14 mg/dL (ref 5–40)

## 2021-08-12 LAB — PSA: Prostate Specific Ag, Serum: 2.8 ng/mL (ref 0.0–4.0)

## 2021-08-18 ENCOUNTER — Other Ambulatory Visit: Payer: Self-pay

## 2021-08-18 ENCOUNTER — Ambulatory Visit (INDEPENDENT_AMBULATORY_CARE_PROVIDER_SITE_OTHER): Payer: Medicare PPO | Admitting: Family Medicine

## 2021-08-18 VITALS — BP 140/70 | HR 68 | Temp 97.4°F | Ht 68.25 in | Wt 176.0 lb

## 2021-08-18 DIAGNOSIS — Z0000 Encounter for general adult medical examination without abnormal findings: Secondary | ICD-10-CM

## 2021-08-18 DIAGNOSIS — I10 Essential (primary) hypertension: Secondary | ICD-10-CM | POA: Diagnosis not present

## 2021-08-18 DIAGNOSIS — E782 Mixed hyperlipidemia: Secondary | ICD-10-CM | POA: Diagnosis not present

## 2021-08-18 MED ORDER — ATORVASTATIN CALCIUM 20 MG PO TABS: 20.0000 mg | ORAL_TABLET | Freq: Every day | ORAL | 1 refills | Status: DC

## 2021-08-18 MED ORDER — LISINOPRIL 5 MG PO TABS: 5.0000 mg | ORAL_TABLET | Freq: Every day | ORAL | 1 refills | Status: DC

## 2021-08-18 NOTE — Patient Instructions (Signed)
Results for orders placed or performed in visit on 06/28/21  Lipid Profile  Result Value Ref Range   Cholesterol, Total 126 100 - 199 mg/dL   Triglycerides 67 0 - 149 mg/dL   HDL 46 >39 mg/dL   VLDL Cholesterol Cal 14 5 - 40 mg/dL   LDL Chol Calc (NIH) 66 0 - 99 mg/dL   Chol/HDL Ratio 2.7 0.0 - 5.0 ratio  Hepatic function panel  Result Value Ref Range   Total Protein 6.5 6.0 - 8.5 g/dL   Albumin 4.4 3.7 - 4.7 g/dL   Bilirubin Total 0.5 0.0 - 1.2 mg/dL   Bilirubin, Direct 0.16 0.00 - 0.40 mg/dL   Alkaline Phosphatase 71 44 - 121 IU/L   AST 23 0 - 40 IU/L   ALT 17 0 - 44 IU/L  Basic Metabolic Panel (BMET)  Result Value Ref Range   Glucose 96 70 - 99 mg/dL   BUN 15 8 - 27 mg/dL   Creatinine, Ser 1.01 0.76 - 1.27 mg/dL   eGFR 78 >59 mL/min/1.73   BUN/Creatinine Ratio 15 10 - 24   Sodium 142 134 - 144 mmol/L   Potassium 4.3 3.5 - 5.2 mmol/L   Chloride 104 96 - 106 mmol/L   CO2 23 20 - 29 mmol/L   Calcium 9.4 8.6 - 10.2 mg/dL  CBC with Differential  Result Value Ref Range   WBC 5.9 3.4 - 10.8 x10E3/uL   RBC 4.84 4.14 - 5.80 x10E6/uL   Hemoglobin 14.6 13.0 - 17.7 g/dL   Hematocrit 42.2 37.5 - 51.0 %   MCV 87 79 - 97 fL   MCH 30.2 26.6 - 33.0 pg   MCHC 34.6 31.5 - 35.7 g/dL   RDW 12.1 11.6 - 15.4 %   Platelets 266 150 - 450 x10E3/uL   Neutrophils 56 Not Estab. %   Lymphs 27 Not Estab. %   Monocytes 10 Not Estab. %   Eos 6 Not Estab. %   Basos 1 Not Estab. %   Neutrophils Absolute 3.3 1.4 - 7.0 x10E3/uL   Lymphocytes Absolute 1.6 0.7 - 3.1 x10E3/uL   Monocytes Absolute 0.6 0.1 - 0.9 x10E3/uL   EOS (ABSOLUTE) 0.3 0.0 - 0.4 x10E3/uL   Basophils Absolute 0.0 0.0 - 0.2 x10E3/uL   Immature Granulocytes 0 Not Estab. %   Immature Grans (Abs) 0.0 0.0 - 0.1 x10E3/uL  PSA  Result Value Ref Range   Prostate Specific Ag, Serum 2.8 0.0 - 4.0 ng/mL

## 2021-08-18 NOTE — Progress Notes (Signed)
Subjective:    Patient ID: Shane Young, male    DOB: Jul 24, 1946, 75 y.o.   MRN: [id number]  HPI The patient comes in today for a wellness visit.  Comes in today for a wellness visit.  He does try to eat healthy does try to take his medicines as directed. He does have some underlying stress related issues but states recently he has been doing fairly well Also to do annual wellness visit today as well for Medicare purposes  A review of their health history was completed.  A review of medications was also completed.  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   Additional concerns: None  AWV- Annual Wellness Visit  The patient was seen for their annual wellness visit. The patient's past medical history, surgical history, and family history were reviewed. Pertinent vaccines were reviewed ( tetanus, pneumonia, shingles, flu) The patient's medication list was reviewed and updated.  The height and weight were entered.  BMI recorded in electronic record elsewhere  Cognitive screening was completed. Outcome of Mini - Cog: 5   Falls /depression screening electronically recorded within record elsewhere  Current tobacco usage:chewing tobacco (All patients who use tobacco were given written and verbal information on quitting)  Recent listing of emergency department/hospitalizations over the past year were reviewed.  current specialist the patient sees on a regular basis:    Medicare annual wellness visit patient questionnaire was reviewed.  A written screening schedule for the patient for the next 5-10 years was given. Appropriate discussion of followup regarding next visit was discussed.      Review of  Systems     Objective:   Physical Exam General-in no acute distress Eyes-no discharge Lungs-respiratory rate normal, CTA CV-no murmurs,RRR Extremities skin warm dry no edema Neuro grossly normal Behavior normal, alert  Labs reviewed with patient PSA normal liver function normal cholesterol profile looks good kidney functions look good CBC no anemia      Assessment & Plan:  1. Essential hypertension, benign Blood pressure good control continue current measures watch diet - atorvastatin (LIPITOR) 20 MG tablet; Take 1 tablet (20 mg total) by mouth daily.  Dispense: 90 tablet; Refill: 1 - lisinopril (ZESTRIL) 5 MG tablet; Take 1 tablet (5 mg total) by mouth daily.  Dispense: 90 tablet; Refill: 1  2. Mixed hyperlipidemia Cholesterol under good control refills given continue current measures - atorvastatin (LIPITOR) 20 MG tablet; Take 1 tablet (20 mg total) by mouth daily.  Dispense: 90 tablet; Refill: 1 - lisinopril (ZESTRIL) 5 MG tablet; Take 1 tablet (5 mg total) by mouth daily.  Dispense: 90 tablet; Refill: 1  3. Well adult exam Adult wellness-complete.wellness physical was conducted today. Importance of diet and exercise were discussed in detail.  In addition to this a discussion regarding safety was also covered. We also reviewed over immunizations and gave recommendations regarding current immunization needed for age.  In addition to this additional areas were also touched on including: Preventative health exams needed:  Colonoscopy 2025  Patient was advised yearly wellness exam   4. Encounter for subsequent annual wellness visit (AWV) in Medicare patient Patient overall doing well.  He is up-to-date on his shingles shot pneumonia shot COVID  shot I did inform him his next colonoscopy is 2025.  Mental health patient is doing well has stressors related to [place] but he is holding his own  Follow-up in 6 months

## 2022-02-15 ENCOUNTER — Ambulatory Visit: Payer: Medicare PPO | Admitting: Family Medicine

## 2022-02-17 ENCOUNTER — Other Ambulatory Visit: Payer: Self-pay | Admitting: Family Medicine

## 2022-02-17 DIAGNOSIS — E782 Mixed hyperlipidemia: Secondary | ICD-10-CM

## 2022-02-17 DIAGNOSIS — I10 Essential (primary) hypertension: Secondary | ICD-10-CM

## 2022-02-22 ENCOUNTER — Ambulatory Visit: Payer: Medicare PPO | Admitting: Family Medicine

## 2022-02-22 ENCOUNTER — Other Ambulatory Visit: Payer: Self-pay

## 2022-02-22 DIAGNOSIS — E782 Mixed hyperlipidemia: Secondary | ICD-10-CM

## 2022-02-22 DIAGNOSIS — Z0000 Encounter for general adult medical examination without abnormal findings: Secondary | ICD-10-CM

## 2022-02-22 DIAGNOSIS — I10 Essential (primary) hypertension: Secondary | ICD-10-CM

## 2022-02-22 MED ORDER — LISINOPRIL 5 MG PO TABS: 5.0000 mg | ORAL_TABLET | Freq: Every day | ORAL | 1 refills | Status: DC

## 2022-02-22 MED ORDER — ATORVASTATIN CALCIUM 20 MG PO TABS: 20.0000 mg | ORAL_TABLET | Freq: Every day | ORAL | 1 refills | Status: DC

## 2022-02-22 NOTE — Patient Instructions (Addendum)
Hi Shane Young  It was good to see you today.  Always enjoy having you here  For the early morning throat congestion you are having I would try OTC loratadine which is the generic of Claritin.  10 mg 1 daily.  Hopefully this will help.  Please do your blood work in early November and follow-up by mid November for a wellness exam  If you need anything before then please let me know  TakeCare-Dr. Nicki Reaper

## 2022-02-22 NOTE — Progress Notes (Signed)
   Subjective:    Patient ID: Shane Young, male    DOB: May 05, 1946, 76 y.o.   MRN: [id number]  Hypertension This is a chronic problem. The current episode started more than 1 year ago. Risk factors for coronary artery disease include dyslipidemia. Treatments tried: lisinopril.   Essential hypertension, benign - Plan: atorvastatin (LIPITOR) 20 MG tablet, lisinopril (ZESTRIL) 5 MG tablet  Mixed hyperlipidemia - Plan: atorvastatin (LIPITOR) 20 MG tablet, lisinopril (ZESTRIL) 5 MG tablet  Patient does have hyperlipidemia takes his medication regular basis He does take his blood pressure medicine regular basis  Review of Systems     Objective:   Physical Exam General-in no acute distress Eyes-no discharge Lungs-respiratory rate normal, CTA CV-no murmurs,RRR Extremities skin warm dry no edema Neuro grossly normal Behavior normal, alert        Assessment & Plan:  HTN- patient seen for follow-up regarding HTN.   Diet, medication compliance, appropriate labs and refills were completed.   Importance of keeping blood pressure under good control to lessen the risk of complications discussed Regular follow-up visits discussed  Hyperlipidemia-importance of diet, weight control, activity, compliance with medications discussed.   Recent labs reviewed.   Any additional labs or refills ordered.   Importance of keeping under good control discussed. Regular follow-up visits discussed  No need to do blood work currently he will do follow-up lab work by fall time with a wellness visit.

## 2022-03-06 DIAGNOSIS — H5203 Hypermetropia, bilateral: Secondary | ICD-10-CM | POA: Diagnosis not present

## 2022-03-06 DIAGNOSIS — H40013 Open angle with borderline findings, low risk, bilateral: Secondary | ICD-10-CM | POA: Diagnosis not present

## 2022-03-06 DIAGNOSIS — H52223 Regular astigmatism, bilateral: Secondary | ICD-10-CM | POA: Diagnosis not present

## 2022-03-06 DIAGNOSIS — H2513 Age-related nuclear cataract, bilateral: Secondary | ICD-10-CM | POA: Diagnosis not present

## 2022-03-06 DIAGNOSIS — H25013 Cortical age-related cataract, bilateral: Secondary | ICD-10-CM | POA: Diagnosis not present

## 2022-03-06 DIAGNOSIS — H524 Presbyopia: Secondary | ICD-10-CM | POA: Diagnosis not present

## 2022-08-22 ENCOUNTER — Ambulatory Visit (INDEPENDENT_AMBULATORY_CARE_PROVIDER_SITE_OTHER): Payer: Medicare PPO | Admitting: Family Medicine

## 2022-08-22 VITALS — BP 135/85 | Temp 97.5°F | Ht 68.25 in | Wt 179.0 lb

## 2022-08-22 DIAGNOSIS — G8929 Other chronic pain: Secondary | ICD-10-CM | POA: Diagnosis not present

## 2022-08-22 DIAGNOSIS — J209 Acute bronchitis, unspecified: Secondary | ICD-10-CM

## 2022-08-22 DIAGNOSIS — M545 Low back pain, unspecified: Secondary | ICD-10-CM

## 2022-08-22 MED ORDER — MELOXICAM 15 MG PO TABS: 15.0000 mg | ORAL_TABLET | Freq: Every day | ORAL | 0 refills | Status: DC | PRN

## 2022-08-22 MED ORDER — AMOXICILLIN-POT CLAVULANATE 875-125 MG PO TABS: 1.0000 | ORAL_TABLET | Freq: Two times a day (BID) | ORAL | 0 refills | Status: DC

## 2022-08-22 MED ORDER — PROMETHAZINE-DM 6.25-15 MG/5ML PO SYRP: 5.0000 mL | ORAL_SOLUTION | Freq: Four times a day (QID) | ORAL | 0 refills | Status: DC | PRN

## 2022-08-22 NOTE — Assessment & Plan Note (Signed)
Treating with Augmentin and Promethazine DM. 

## 2022-08-22 NOTE — Assessment & Plan Note (Signed)
Treating with mobic.

## 2022-08-22 NOTE — Progress Notes (Signed)
Subjective:  Patient ID: Shane Young, male    DOB: November 25, 1945  Age: 76 y.o. MRN: [id number]  CC: Chief Complaint  Patient presents with   Cough    Congestion since last Wednesday- patient states the worse is over but wants something to clear it out Covid test negative    HPI:  76 year old male presents for evaluation of the above.  Symptoms since last week. Reports cough and chest congestion. No fever. No SOB. Cough is productive. No reported sick contacts. COVID testing negative. No relief with OTC treatment.   Also, patient reports recent recurrence of low back pain. Previously treated with injections. Was also given Mobic previously, which worked well. He would like me to prescribe this for him.  Patient Active Problem List   Diagnosis Date Noted   Acute exacerbation of chronic low back pain 08/22/2022   Acute bronchitis 08/22/2022   PTSD (post-traumatic stress disorder) 05/26/2020   GAD (generalized anxiety disorder) 05/26/2020   Essential hypertension, benign 02/19/2013   Hyperlipidemia 02/19/2013    Social Hx   Social History   Socioeconomic History   Marital status: Married    Spouse name: Not on file   Number of children: Not on file   Years of education: Not on file   Highest education level: Not on file  Occupational History   Not on file  Tobacco Use   Smoking status: Never   Smokeless tobacco: Current    Types: Chew  Substance and Sexual Activity   Alcohol use: Yes    Alcohol/week: 0.0 standard drinks of alcohol    Comment: 2-3 beers/day   Drug use: No   Sexual activity: Not on file  Other Topics Concern   Not on file  Social History Narrative   Not on file   Social Determinants of Health   Financial Resource Strain: Not on file  Food Insecurity: Not on file  Transportation Needs: Not on file  Physical Activity: Not on file  Stress: Not on file  Social Connections: Not on file    Review of Systems Per HPI  Objective:  BP 135/85    Temp (!) 97.5 F (36.4 C) (Oral)   Ht 5' 8.25" (1.734 m)   Wt 179 lb (81.2 kg)   BMI 27.02 kg/m      08/22/2022    4:06 PM 02/22/2022    8:35 AM 02/22/2022    8:12 AM  BP/Weight  Systolic BP 135 122 137  Diastolic BP 85 70 89  Wt. (Lbs) 179  176.4  BMI 27.02 kg/m2  26.63 kg/m2    Physical Exam Vitals and nursing note reviewed.  Constitutional:      General: He is not in acute distress.    Appearance: Normal appearance.  HENT:     Head: Normocephalic and atraumatic.     Mouth/Throat:     Pharynx: Oropharynx is clear.  Eyes:     Conjunctiva/sclera: Conjunctivae normal.  Cardiovascular:     Rate and Rhythm: Normal rate and regular rhythm.  Pulmonary:     Effort: Pulmonary effort is normal.     Breath sounds: Rhonchi present. No wheezing.  Neurological:     Mental Status: He is alert.  Psychiatric:        Mood and Affect: Mood normal.        Behavior: Behavior normal.     Lab Results  Component Value Date   WBC 5.9 08/11/2021   HGB 14.6 08/11/2021   HCT  42.2 08/11/2021   PLT 266 08/11/2021   GLUCOSE 96 08/11/2021   CHOL 126 08/11/2021   TRIG 67 08/11/2021   HDL 46 08/11/2021   LDLCALC 66 08/11/2021   ALT 17 08/11/2021   AST 23 08/11/2021   NA 142 08/11/2021   K 4.3 08/11/2021   CL 104 08/11/2021   CREATININE 1.01 08/11/2021   BUN 15 08/11/2021   CO2 23 08/11/2021   PSA 1.89 03/07/2014   HGBA1C 5.1 05/08/2018     Assessment & Plan:   Problem List Items Addressed This Visit       Respiratory   Acute bronchitis - Primary    Treating with Augmentin and Promethazine-DM.        Other   Acute exacerbation of chronic low back pain    Treating with mobic.      Relevant Medications   meloxicam (MOBIC) 15 MG tablet    Meds ordered this encounter  Medications   meloxicam (MOBIC) 15 MG tablet    Sig: Take 1 tablet (15 mg total) by mouth daily as needed.    Dispense:  30 tablet    Refill:  0   amoxicillin-clavulanate (AUGMENTIN) 875-125 MG  tablet    Sig: Take 1 tablet by mouth 2 (two) times daily.    Dispense:  14 tablet    Refill:  0   promethazine-dextromethorphan (PROMETHAZINE-DM) 6.25-15 MG/5ML syrup    Sig: Take 5 mLs by mouth 4 (four) times daily as needed for cough.    Dispense:  118 mL    Refill:  0    Follow-up:  Return if symptoms worsen or fail to improve.  Everlene Other DO [facility]

## 2022-08-28 ENCOUNTER — Ambulatory Visit (INDEPENDENT_AMBULATORY_CARE_PROVIDER_SITE_OTHER): Payer: Medicare PPO | Admitting: Nurse Practitioner

## 2022-08-28 ENCOUNTER — Encounter: Payer: Self-pay | Admitting: Nurse Practitioner

## 2022-08-28 ENCOUNTER — Ambulatory Visit: Payer: Self-pay | Admitting: Family Medicine

## 2022-08-28 ENCOUNTER — Telehealth: Payer: Self-pay | Admitting: Family Medicine

## 2022-08-28 VITALS — BP 145/77 | HR 72 | Temp 98.6°F | Ht 68.0 in | Wt 179.2 lb

## 2022-08-28 DIAGNOSIS — Z125 Encounter for screening for malignant neoplasm of prostate: Secondary | ICD-10-CM

## 2022-08-28 DIAGNOSIS — Z0000 Encounter for general adult medical examination without abnormal findings: Secondary | ICD-10-CM | POA: Diagnosis not present

## 2022-08-28 DIAGNOSIS — I10 Essential (primary) hypertension: Secondary | ICD-10-CM

## 2022-08-28 DIAGNOSIS — E782 Mixed hyperlipidemia: Secondary | ICD-10-CM

## 2022-08-28 NOTE — Addendum Note (Signed)
Addended by: Marlowe Shores on: 08/28/2022 11:05 AM   Modules accepted: Orders

## 2022-08-28 NOTE — Patient Instructions (Addendum)
Thank you for coming for your annual wellness visit.  Please follow through on any advice that was given to you by today's visit. Remember to maintain compliance with your medications as discussed today.  Also remember it is important to eat a healthy diet and to stay physically active on a daily basis.  Please follow through with any testing or recommended followup office visits as was discussed today. You are due the following test coming up:  Colonoscopy up to date. Due 2025 Flu shot up to date.  COVID vaccine patient to get vaccine when feeling better Pneumonia vaccine up to date Hepatitis Screening up to date Shingles vaccine up to date       Finally remembered that the annual wellness visit does not take the place of regularly scheduled office visits  chronic health problems such as hypertension/diabetes/cholesterol visits.

## 2022-08-28 NOTE — Progress Notes (Signed)
   Subjective:    Patient ID: Shane Young, male    DOB: 1946-03-08, 76 y.o.   MRN: [id number]  HPI AWV- Annual Wellness Visit  The patient was seen for their annual wellness visit. The patient's past medical history, surgical history, and family history were reviewed. Pertinent vaccines were reviewed ( tetanus, pneumonia, shingles, flu) The patient's medication list was reviewed and updated.  The height and weight were entered.  BMI recorded in electronic record elsewhere  Cognitive screening was completed. Outcome of Mini - Cog: pass   Falls /depression screening electronically recorded within record elsewhere  Current tobacco usage: pt does chew; quit smoking 46 years ago (All patients who use tobacco were given written and verbal information on quitting)  Recent listing of emergency department/hospitalizations over the past year were reviewed.  current specialist the patient sees on a regular basis:    Medicare annual wellness visit patient questionnaire was reviewed.  A written screening schedule for the patient for the next 5-10 years was given. Appropriate discussion of followup regarding next visit was discussed.      Review of Systems  All other systems reviewed and are negative.      Objective:   Physical Exam Vitals reviewed.  Constitutional:      General: He is not in acute distress.    Appearance: Normal appearance. He is normal weight. He is not ill-appearing, toxic-appearing or diaphoretic.  HENT:     Head: Normocephalic and atraumatic.  Neurological:     Mental Status: He is alert.  Psychiatric:        Mood and Affect: Mood normal.        Behavior: Behavior normal.            Assessment & Plan:   1. Encounter for subsequent annual wellness visit (AWV) in Medicare patient Adult wellness-complete.wellness physical was conducted today. Importance of diet and exercise were discussed in detail.  Importance of stress reduction and healthy  living were discussed.  In addition to this a discussion regarding safety was also covered.  We also reviewed over immunizations and gave recommendations regarding current immunization needed for age.   In addition to this additional areas were also touched on including: Preventative health exams needed:  Colonoscopy up to date. Due 2025 Flu shot up to date.  COVID vaccine patient to get vaccine when feeling better Pneumonia vaccine up to date Hepatitis Screening up to date Shingles vaccine up to date  Patient was advised yearly wellness exam

## 2022-08-28 NOTE — Telephone Encounter (Signed)
Lab orders placed.  

## 2022-08-28 NOTE — Telephone Encounter (Signed)
Pt has physical set up for 09/09/22 and is needing lab work done. Last labs ordered have not been completed. CBC, BMET, Hepatic, Lipid and PSA. Please advise. Thank you (Pt is aware that lab orders will be placed; no need to call pt).

## 2022-08-28 NOTE — Telephone Encounter (Signed)
Hi-all the lab work that was ordered back in May would be applicable for the lab work that I am interested in him doing.

## 2022-09-05 DIAGNOSIS — E782 Mixed hyperlipidemia: Secondary | ICD-10-CM | POA: Diagnosis not present

## 2022-09-05 DIAGNOSIS — Z125 Encounter for screening for malignant neoplasm of prostate: Secondary | ICD-10-CM | POA: Diagnosis not present

## 2022-09-05 DIAGNOSIS — Z0000 Encounter for general adult medical examination without abnormal findings: Secondary | ICD-10-CM | POA: Diagnosis not present

## 2022-09-05 DIAGNOSIS — I10 Essential (primary) hypertension: Secondary | ICD-10-CM | POA: Diagnosis not present

## 2022-09-06 LAB — BASIC METABOLIC PANEL
BUN/Creatinine Ratio: 12 (ref 10–24)
BUN: 13 mg/dL (ref 8–27)
CO2: 22 mmol/L (ref 20–29)
Calcium: 9.3 mg/dL (ref 8.6–10.2)
Chloride: 106 mmol/L (ref 96–106)
Creatinine, Ser: 1.06 mg/dL (ref 0.76–1.27)
Glucose: 106 mg/dL — ABNORMAL HIGH (ref 70–99)
Potassium: 5.2 mmol/L (ref 3.5–5.2)
Sodium: 143 mmol/L (ref 134–144)
eGFR: 73 mL/min/{1.73_m2} (ref >59)

## 2022-09-06 LAB — CBC WITH DIFFERENTIAL/PLATELET
Basophils Absolute: 0 10*3/uL (ref 0.0–0.2)
Basos: 1 %
EOS (ABSOLUTE): 0.4 10*3/uL (ref 0.0–0.4)
Eos: 5 %
Hematocrit: 45.3 % (ref 37.5–51.0)
Hemoglobin: 15.5 g/dL (ref 13.0–17.7)
Immature Grans (Abs): 0 10*3/uL (ref 0.0–0.1)
Immature Granulocytes: 0 %
Lymphocytes Absolute: 2.1 10*3/uL (ref 0.7–3.1)
Lymphs: 28 %
MCH: 29.7 pg (ref 26.6–33.0)
MCHC: 34.2 g/dL (ref 31.5–35.7)
MCV: 87 fL (ref 79–97)
Monocytes Absolute: 0.7 10*3/uL (ref 0.1–0.9)
Monocytes: 9 %
Neutrophils Absolute: 4.1 10*3/uL (ref 1.4–7.0)
Neutrophils: 57 %
Platelets: 347 10*3/uL (ref 150–450)
RBC: 5.22 x10E6/uL (ref 4.14–5.80)
RDW: 11.9 % (ref 11.6–15.4)
WBC: 7.3 10*3/uL (ref 3.4–10.8)

## 2022-09-06 LAB — HEPATIC FUNCTION PANEL
ALT: 17 IU/L (ref 0–44)
AST: 16 IU/L (ref 0–40)
Albumin: 4.5 g/dL (ref 3.8–4.8)
Alkaline Phosphatase: 71 IU/L (ref 44–121)
Bilirubin Total: 0.4 mg/dL (ref 0.0–1.2)
Bilirubin, Direct: 0.15 mg/dL (ref 0.00–0.40)
Total Protein: 6.7 g/dL (ref 6.0–8.5)

## 2022-09-06 LAB — LIPID PANEL
Chol/HDL Ratio: 2.9 ratio (ref 0.0–5.0)
Cholesterol, Total: 137 mg/dL (ref 100–199)
HDL: 47 mg/dL (ref >39)
LDL Chol Calc (NIH): 76 mg/dL (ref 0–99)
Triglycerides: 66 mg/dL (ref 0–149)
VLDL Cholesterol Cal: 14 mg/dL (ref 5–40)

## 2022-09-06 LAB — PSA: Prostate Specific Ag, Serum: 3.5 ng/mL (ref 0.0–4.0)

## 2022-09-08 ENCOUNTER — Ambulatory Visit: Payer: Medicare PPO | Admitting: Family Medicine

## 2022-09-08 VITALS — BP 132/82 | HR 75 | Temp 98.1°F | Ht 68.0 in | Wt 182.0 lb

## 2022-09-08 DIAGNOSIS — S300XXD Contusion of lower back and pelvis, subsequent encounter: Secondary | ICD-10-CM

## 2022-09-08 DIAGNOSIS — Z0000 Encounter for general adult medical examination without abnormal findings: Secondary | ICD-10-CM | POA: Diagnosis not present

## 2022-09-08 DIAGNOSIS — R0981 Nasal congestion: Secondary | ICD-10-CM | POA: Diagnosis not present

## 2022-09-08 DIAGNOSIS — E782 Mixed hyperlipidemia: Secondary | ICD-10-CM

## 2022-09-08 DIAGNOSIS — I10 Essential (primary) hypertension: Secondary | ICD-10-CM

## 2022-09-08 MED ORDER — MELOXICAM 15 MG PO TABS: 15.0000 mg | ORAL_TABLET | Freq: Every day | ORAL | 0 refills | Status: DC | PRN

## 2022-09-08 MED ORDER — ATORVASTATIN CALCIUM 20 MG PO TABS: 20.0000 mg | ORAL_TABLET | Freq: Every day | ORAL | 1 refills | Status: DC

## 2022-09-08 MED ORDER — LISINOPRIL 5 MG PO TABS: 5.0000 mg | ORAL_TABLET | Freq: Every day | ORAL | 1 refills | Status: DC

## 2022-09-08 NOTE — Patient Instructions (Signed)
Hi Shane Young  It was good seeing you today.  I hope you and your family have a Investment banker, corporate tomorrow evening.  We will see you back in 6 months sooner if any problems  Please take care-Dr. Lorin Picket

## 2022-09-08 NOTE — Progress Notes (Signed)
   Subjective:    Patient ID: Shane Young, male    DOB: [birth year], 76 y.o.   MRN: [id number]  HPI Wellness exam  The patient comes in today for a wellness visit.    A review of their health history was completed.  A review of medications was also completed.  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   Additional concerns: No current concerns other than ongoing back discomfort Patient also relates a lot of head congestion postnasal drainage most of the time the phlegm is clear   Fell 3 weeks ago , lower back hurt back when he fell    Review of Systems     Objective:   Physical Exam General-in no acute distress Eyes-no discharge Lungs-respiratory rate normal, CTA CV-no murmurs,RRR Extremities skin warm dry no edema Neuro grossly normal Behavior normal, alert  Prostate exam not indicated      Assessment & Plan:  1. Well adult exam Adult wellness-complete.wellness physical was conducted today. Importance of diet and exercise were discussed in detail.  Importance of stress reduction and healthy living were discussed.  In addition to this a discussion regarding safety was also covered.  We also reviewed over immunizations and gave recommendations regarding current immunization needed for age.   In addition to this additional areas were also touched on including: Preventative health exams needed:  Colonoscopy next 1 is 2025  Patient was advised yearly wellness exam   2. Encounter for subsequent annual wellness visit (AWV) in Medicare  patient Highly recommended RSV vaccine information given  3. Mixed hyperlipidemia Continue statin Hyperlipidemia-importance of diet, weight control, activity, compliance with medications discussed.   Recent labs reviewed.   Any additional labs or refills ordered.   Importance of keeping under good control discussed. Regular follow-up visits discussed Lab work looks good - lisinopril (ZESTRIL) 5 MG tablet; Take 1 tablet (5 mg total) by mouth daily.  Dispense: 90 tablet; Refill: 1 - atorvastatin (LIPITOR) 20 MG tablet; Take 1 tablet (20 mg total) by mouth daily.  Dispense: 90 tablet; Refill: 1  4. Essential hypertension, benign Blood pressure good on recheck.bsht  - lisinopril (ZESTRIL) 5 MG tablet; Take 1 tablet (5 mg total) by mouth daily.  Dispense: 90 tablet; Refill: 1 - atorvastatin (LIPITOR) 20 MG tablet; Take 1 tablet (20 mg total) by mouth daily.  Dispense: 90 tablet; Refill: 1  5. Lumbar contusion, subsequent encounter Gentle stretching exercises May use meloxicam for an additional 2 weeks after that I recommend that he stop the meloxicam  6. Sinus congestion Postnasal drainage recommend humidifier do not feel patient needs antibiotics currently

## 2022-09-20 ENCOUNTER — Telehealth: Payer: Self-pay

## 2022-09-20 MED ORDER — PROMETHAZINE-DM 6.25-15 MG/5ML PO SYRP: 5.0000 mL | ORAL_SOLUTION | Freq: Two times a day (BID) | ORAL | 0 refills | Status: DC | PRN

## 2022-09-20 MED ORDER — AMOXICILLIN-POT CLAVULANATE 875-125 MG PO TABS: 1.0000 | ORAL_TABLET | Freq: Two times a day (BID) | ORAL | 0 refills | Status: DC

## 2022-09-20 NOTE — Telephone Encounter (Signed)
Please touch base with patient-make sure that he is not having fever or shortness of breath if he is having either 1 of those he needs to be seen tomorrow  If he is having severe issues he may need to go to urgent care or ER  If it is just more so cough congestion but is not feeling short of breath he can do the following  Plus he should also do a home COVID test if positive he should let us know  in this situation may do Augmentin 875 mg 1 taken twice daily for 7 days Promethazine DM 1 teaspoon twice daily as needed cough caution drowsiness home use only-75 mL If he gets worse he needs to be seen

## 2022-09-20 NOTE — Telephone Encounter (Signed)
Spoke with patient over the phone advised patient per Dr.Scott Patient denies shortness of breath or severe issues and agrees to recommendations of taken COVID test and letting the office know if positive. Patient verbalized understanding and medications were sent to pharmacy. May do Augmentin 875 mg 1 taken twice daily for 7 days Promethazine DM 1 teaspoon twice daily as needed cough caution drowsiness home use only-75 mL If he gets worse he needs to be seen

## 2022-09-20 NOTE — Telephone Encounter (Signed)
Patient seen by Dr Adriana Simas for bronchitis on 08/22/22. Patient stated the chest congestion has come back. Patient would like to know could he get amox-call 125 mg and promethazine DM syrup for cough. This is what Dr Adriana Simas prescribed and seemed to work for him.

## 2023-03-06 ENCOUNTER — Telehealth: Payer: Self-pay | Admitting: Family Medicine

## 2023-03-06 ENCOUNTER — Other Ambulatory Visit: Payer: Self-pay | Admitting: Family Medicine

## 2023-03-06 DIAGNOSIS — I10 Essential (primary) hypertension: Secondary | ICD-10-CM

## 2023-03-06 DIAGNOSIS — E782 Mixed hyperlipidemia: Secondary | ICD-10-CM

## 2023-03-06 DIAGNOSIS — Z79899 Other long term (current) drug therapy: Secondary | ICD-10-CM

## 2023-03-06 DIAGNOSIS — R739 Hyperglycemia, unspecified: Secondary | ICD-10-CM

## 2023-03-06 NOTE — Telephone Encounter (Signed)
Patient has appointment on 6/10 and needing labs done before he comes in

## 2023-03-06 NOTE — Telephone Encounter (Signed)
Lipid, liver, metabolic 7, urine ACR, CBC, A1c Fasting hyperglycemia, hyperlipidemia, hypertension, high risk med

## 2023-03-07 DIAGNOSIS — R739 Hyperglycemia, unspecified: Secondary | ICD-10-CM | POA: Diagnosis not present

## 2023-03-07 DIAGNOSIS — Z79899 Other long term (current) drug therapy: Secondary | ICD-10-CM | POA: Diagnosis not present

## 2023-03-07 DIAGNOSIS — I10 Essential (primary) hypertension: Secondary | ICD-10-CM | POA: Diagnosis not present

## 2023-03-07 DIAGNOSIS — E782 Mixed hyperlipidemia: Secondary | ICD-10-CM | POA: Diagnosis not present

## 2023-03-07 NOTE — Telephone Encounter (Signed)
Blood work ordered in EPIC. Left message to return call  

## 2023-03-07 NOTE — Telephone Encounter (Signed)
Patient notified

## 2023-03-08 LAB — MICROALBUMIN / CREATININE URINE RATIO
Creatinine, Urine: 130 mg/dL
Microalb/Creat Ratio: 6 mg/g creat (ref 0–29)
Microalbumin, Urine: 8.2 ug/mL

## 2023-03-08 LAB — BASIC METABOLIC PANEL
BUN/Creatinine Ratio: 14 (ref 10–24)
BUN: 14 mg/dL (ref 8–27)
CO2: 24 mmol/L (ref 20–29)
Calcium: 9.5 mg/dL (ref 8.6–10.2)
Chloride: 107 mmol/L — ABNORMAL HIGH (ref 96–106)
Creatinine, Ser: 1 mg/dL (ref 0.76–1.27)
Glucose: 97 mg/dL (ref 70–99)
Potassium: 4.7 mmol/L (ref 3.5–5.2)
Sodium: 142 mmol/L (ref 134–144)
eGFR: 78 mL/min/{1.73_m2} (ref >59)

## 2023-03-08 LAB — HEPATIC FUNCTION PANEL
ALT: 19 IU/L (ref 0–44)
AST: 20 IU/L (ref 0–40)
Albumin: 4.4 g/dL (ref 3.8–4.8)
Alkaline Phosphatase: 64 IU/L (ref 44–121)
Bilirubin Total: 0.5 mg/dL (ref 0.0–1.2)
Bilirubin, Direct: 0.16 mg/dL (ref 0.00–0.40)
Total Protein: 6.6 g/dL (ref 6.0–8.5)

## 2023-03-08 LAB — HEMOGLOBIN A1C
Est. average glucose Bld gHb Est-mCnc: 120 mg/dL
Hgb A1c MFr Bld: 5.8 % — ABNORMAL HIGH (ref 4.8–5.6)

## 2023-03-08 LAB — CBC WITH DIFFERENTIAL/PLATELET
Basophils Absolute: 0.1 10*3/uL (ref 0.0–0.2)
Basos: 1 %
EOS (ABSOLUTE): 0.2 10*3/uL (ref 0.0–0.4)
Eos: 3 %
Hematocrit: 42.2 % (ref 37.5–51.0)
Hemoglobin: 14.2 g/dL (ref 13.0–17.7)
Immature Grans (Abs): 0 10*3/uL (ref 0.0–0.1)
Immature Granulocytes: 0 %
Lymphocytes Absolute: 1.6 10*3/uL (ref 0.7–3.1)
Lymphs: 24 %
MCH: 29.7 pg (ref 26.6–33.0)
MCHC: 33.6 g/dL (ref 31.5–35.7)
MCV: 88 fL (ref 79–97)
Monocytes Absolute: 0.6 10*3/uL (ref 0.1–0.9)
Monocytes: 9 %
Neutrophils Absolute: 4.3 10*3/uL (ref 1.4–7.0)
Neutrophils: 63 %
Platelets: 252 10*3/uL (ref 150–450)
RBC: 4.78 x10E6/uL (ref 4.14–5.80)
RDW: 13.2 % (ref 11.6–15.4)
WBC: 6.9 10*3/uL (ref 3.4–10.8)

## 2023-03-08 LAB — LIPID PANEL
Chol/HDL Ratio: 2.5 ratio (ref 0.0–5.0)
Cholesterol, Total: 117 mg/dL (ref 100–199)
HDL: 46 mg/dL (ref >39)
LDL Chol Calc (NIH): 57 mg/dL (ref 0–99)
Triglycerides: 65 mg/dL (ref 0–149)
VLDL Cholesterol Cal: 14 mg/dL (ref 5–40)

## 2023-03-12 ENCOUNTER — Ambulatory Visit (INDEPENDENT_AMBULATORY_CARE_PROVIDER_SITE_OTHER): Payer: Medicare PPO | Admitting: Family Medicine

## 2023-03-12 ENCOUNTER — Encounter: Payer: Self-pay | Admitting: Family Medicine

## 2023-03-12 VITALS — BP 136/78 | HR 73 | Ht 68.0 in | Wt 182.4 lb

## 2023-03-12 DIAGNOSIS — I10 Essential (primary) hypertension: Secondary | ICD-10-CM | POA: Diagnosis not present

## 2023-03-12 DIAGNOSIS — E782 Mixed hyperlipidemia: Secondary | ICD-10-CM

## 2023-03-12 DIAGNOSIS — R7303 Prediabetes: Secondary | ICD-10-CM

## 2023-03-12 DIAGNOSIS — R0981 Nasal congestion: Secondary | ICD-10-CM

## 2023-03-12 NOTE — Progress Notes (Signed)
   Subjective:    Patient ID: Shane Young, male    DOB: July 19, 1946, 77 y.o.   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   Patient states is having issues with drainage and phlegm.   Patient relates a lot of head congestion drainage plan packing to get the congestion free no abnormal mucus denies hemoptysis denies chest congestion shortness of breath  Patient for blood pressure check up.  The patient does have hypertension.   Patient relates dietary measures try to minimize salt The importance of healthy diet and activity were discussed Patient relates compliance  Patient here for follow-up regarding cholesterol.    Patient relates taking medication on a regular basis Denies problems with medication Importance of dietary measures discussed Regular lab work regarding lipid and liver was checked and if needing additional labs was appropriately ordered  Recent lab work does show slight hyperglycemia with elevated A1c  Review of Systems     Objective:   Physical Exam General-in no acute distress Eyes-no discharge Lungs-respiratory rate normal, CTA CV-no murmurs,RRR Extremities skin warm dry no edema Neuro grossly normal Behavior normal, alert        Assessment & Plan:  1. Mixed hyperlipidemia Hyperlipidemia-importance of diet, weight control, activity, compliance with medications discussed.   Recent labs reviewed.   Any additional labs or refills ordered.   Importance of keeping under good control discussed. Regular follow-up visits discussed    2. Essential hypertension, benign HTN- patient seen for follow-up regarding HTN.   Diet, medication compliance, appropriate labs and refills were completed.   Importance of keeping blood pressure under good control to lessen the risk of complications  discussed Regular follow-up visits discussed   3. Sinus congestion Loratadine daily if this does not help notify us But no evidence of a bacterial infection at this time no pneumonia warning signs discussed if ongoing troubles or problems follow-up 4. Prediabetes Portion control minimizing starches regular activity no need for additional medicines  Overall stress levels he is doing well he states he does get nervous if he travels or gets around large groups but not depressed currently  Wellness in 6 months

## 2023-03-12 NOTE — Progress Notes (Signed)
   Subjective:    Patient ID: Shane Young, male    DOB: Sep 23, 1946, 77 y.o.   MRN: [id number]  HPI    Review of Systems     Objective:   Physical Exam        Assessment & Plan:

## 2023-03-27 DIAGNOSIS — H40013 Open angle with borderline findings, low risk, bilateral: Secondary | ICD-10-CM | POA: Diagnosis not present

## 2023-03-27 DIAGNOSIS — H5203 Hypermetropia, bilateral: Secondary | ICD-10-CM | POA: Diagnosis not present

## 2023-03-27 DIAGNOSIS — H2513 Age-related nuclear cataract, bilateral: Secondary | ICD-10-CM | POA: Diagnosis not present

## 2023-03-27 DIAGNOSIS — H524 Presbyopia: Secondary | ICD-10-CM | POA: Diagnosis not present

## 2023-03-27 DIAGNOSIS — H25013 Cortical age-related cataract, bilateral: Secondary | ICD-10-CM | POA: Diagnosis not present

## 2023-03-27 DIAGNOSIS — H52223 Regular astigmatism, bilateral: Secondary | ICD-10-CM | POA: Diagnosis not present

## 2023-05-13 ENCOUNTER — Other Ambulatory Visit: Payer: Self-pay | Admitting: Family Medicine

## 2023-05-13 DIAGNOSIS — E782 Mixed hyperlipidemia: Secondary | ICD-10-CM

## 2023-05-13 DIAGNOSIS — I10 Essential (primary) hypertension: Secondary | ICD-10-CM

## 2023-05-17 DIAGNOSIS — L821 Other seborrheic keratosis: Secondary | ICD-10-CM | POA: Diagnosis not present

## 2023-05-17 DIAGNOSIS — L570 Actinic keratosis: Secondary | ICD-10-CM | POA: Diagnosis not present

## 2023-05-17 DIAGNOSIS — X32XXXD Exposure to sunlight, subsequent encounter: Secondary | ICD-10-CM | POA: Diagnosis not present

## 2023-09-11 ENCOUNTER — Ambulatory Visit: Payer: Medicare PPO | Admitting: Family Medicine

## 2023-09-11 VITALS — BP 137/80 | Ht 68.0 in | Wt 182.0 lb

## 2023-09-11 DIAGNOSIS — I10 Essential (primary) hypertension: Secondary | ICD-10-CM | POA: Diagnosis not present

## 2023-09-11 DIAGNOSIS — R7303 Prediabetes: Secondary | ICD-10-CM | POA: Diagnosis not present

## 2023-09-11 DIAGNOSIS — R0981 Nasal congestion: Secondary | ICD-10-CM | POA: Diagnosis not present

## 2023-09-11 DIAGNOSIS — E782 Mixed hyperlipidemia: Secondary | ICD-10-CM | POA: Diagnosis not present

## 2023-09-11 MED ORDER — FLUTICASONE PROPIONATE 50 MCG/ACT NA SUSP: 2.0000 | Freq: Every day | NASAL | 6 refills | Status: DC

## 2023-09-11 NOTE — Progress Notes (Signed)
   Subjective:    Patient ID: Shane Young, male    DOB: 1945/10/08, 78 y.o.   MRN: [id number]  HPI  Patient arrives for follow up on blood pressure. Patient also having issues with phlegm in the morning. Patient relates a lot of postnasal drainage in the morning throat congestion some coughing denies high fever chills denies wheezing difficulty breathing  Patient for blood pressure check up.  The patient does have hypertension.   Patient relates dietary measures try to minimize salt The importance of healthy diet and activity were discussed Patient relates compliance  Patient here for follow-up regarding cholesterol.    Patient relates taking medication on a regular basis Denies problems with medication Importance of dietary measures discussed Regular lab work regarding lipid and liver was checked and if needing additional labs was appropriately ordered  Relates compliance with medicine states does a good job with eating relatively healthy Review of Systems     Objective:   Physical Exam  General-in no acute distress Eyes-no discharge Lungs-respiratory rate normal, CTA CV-no murmurs,RRR Extremities skin warm dry no edema Neuro grossly normal Behavior normal, alert HEENT is benign      Assessment & Plan:  1. Essential hypertension, benign HTN- patient seen for follow-up regarding HTN.   Diet, medication compliance, appropriate labs and refills were completed.   Importance of keeping blood pressure under good control to lessen the risk of complications discussed Regular follow-up visits discussed   2. Mixed hyperlipidemia Hyperlipidemia-importance of diet, weight control, activity, compliance with medications discussed.   Recent labs reviewed.   Any additional labs or refills ordered.   Importance of keeping under good control discussed. Regular follow-up visits discussed   3. Sinus congestion Recommend humidifier Saline nasal spray Flonase Follow-up if  progressive troubles If not doing better over the course the next 4 weeks let us know and we will help set up with ENT Patient does do a good job trying to be careful with starches No need to do lab work currently but we will do labs before follow-up visit

## 2023-09-11 NOTE — Addendum Note (Signed)
Addended by: Margaretha Sheffield on: 09/11/2023 03:30 PM   Modules accepted: Orders

## 2023-09-11 NOTE — Progress Notes (Signed)
5

## 2023-10-27 ENCOUNTER — Other Ambulatory Visit: Payer: Self-pay | Admitting: Family Medicine

## 2023-10-27 DIAGNOSIS — E782 Mixed hyperlipidemia: Secondary | ICD-10-CM

## 2023-10-27 DIAGNOSIS — I10 Essential (primary) hypertension: Secondary | ICD-10-CM

## 2023-10-29 ENCOUNTER — Other Ambulatory Visit: Payer: Self-pay

## 2023-10-29 DIAGNOSIS — E782 Mixed hyperlipidemia: Secondary | ICD-10-CM

## 2023-10-29 DIAGNOSIS — I10 Essential (primary) hypertension: Secondary | ICD-10-CM

## 2023-10-29 MED ORDER — ATORVASTATIN CALCIUM 20 MG PO TABS: 20.0000 mg | ORAL_TABLET | Freq: Every day | ORAL | 1 refills | Status: DC

## 2023-10-29 MED ORDER — LISINOPRIL 5 MG PO TABS: 5.0000 mg | ORAL_TABLET | Freq: Every day | ORAL | 1 refills | Status: DC

## 2024-03-07 ENCOUNTER — Ambulatory Visit

## 2024-03-07 VITALS — Ht 68.0 in | Wt 182.0 lb

## 2024-03-07 DIAGNOSIS — Z0000 Encounter for general adult medical examination without abnormal findings: Secondary | ICD-10-CM | POA: Diagnosis not present

## 2024-03-07 NOTE — Progress Notes (Signed)
 Subjective:   Shane Young is a 78 y.o. who presents for a Medicare Wellness preventive visit.  As a reminder, Annual Wellness Visits don't include a physical exam, and some assessments may be limited, especially if this visit is performed virtually. We may recommend an in-person follow-up visit with your provider if needed.  Visit Complete: Virtual I connected with  Shery Done on 03/07/24 by a audio enabled telemedicine application and verified that I am speaking with the correct person using two identifiers.  Patient Location: Home  Provider Location: Home Office  I discussed the limitations of evaluation and management by telemedicine. The patient expressed understanding and agreed to proceed.  Vital Signs: Because this visit was a virtual/telehealth visit, some criteria may be missing or patient reported. Any vitals not documented were not able to be obtained and vitals that have been documented are patient reported.  VideoDeclined- This patient declined Librarian, academic. Therefore the visit was completed with audio only.  Persons Participating in Visit: Patient.  AWV Questionnaire: No: Patient Medicare AWV questionnaire was not completed prior to this visit.  Cardiac Risk Factors include: advanced age (>25men, >66 women);male gender;hypertension;dyslipidemia     Objective:     Today's Vitals   03/07/24 1004  Weight: 182 lb (82.6 kg)  Height: 5\' 8"  (1.727 m)   Body mass index is 27.67 kg/m.     03/07/2024   10:05 AM 08/07/2017    8:16 AM 08/03/2017    8:12 AM 07/27/2017    8:10 AM 07/10/2017    8:12 AM 07/04/2017    9:01 AM 06/27/2017    1:56 PM  Advanced Directives  Does Patient Have a Medical Advance Directive? No No No No No No --  Would patient like information on creating a medical advance directive? Yes (MAU/Ambulatory/Procedural Areas - Information given)          Current Medications (verified) Outpatient Encounter  Medications as of 03/07/2024  Medication Sig   atorvastatin  (LIPITOR) 20 MG tablet Take 1 tablet (20 mg total) by mouth daily.   fluticasone  (FLONASE ) 50 MCG/ACT nasal spray Place 2 sprays into both nostrils daily.   lisinopril  (ZESTRIL ) 5 MG tablet Take 1 tablet (5 mg total) by mouth daily.   No facility-administered encounter medications on file as of 03/07/2024.    Allergies (verified) Patient has no known allergies.   History: Past Medical History:  Diagnosis Date   Hyperlipidemia    Hypertension    Past Surgical History:  Procedure Laterality Date   APPENDECTOMY     COLON SURGERY     55 years ago    COLONOSCOPY N/A 08/17/2014   Procedure: COLONOSCOPY;  Surgeon: Suzette Espy, MD;  Location: [facility];  Service: Endoscopy;  Laterality: N/A;  8:30 AM   Family History  Problem Relation Age of Onset   Hypertension Mother    Diabetes Mother    Social History   Socioeconomic History   Marital status: Married    Spouse name: Not on file   Number of children: Not on file   Years of education: Not on file   Highest education level: Not on file  Occupational History   Not on file  Tobacco Use   Smoking status: Never   Smokeless tobacco: Current    Types: Chew  Substance and Sexual Activity   Alcohol use: Yes    Alcohol/week: 0.0 standard drinks of alcohol    Comment: 2-3 beers/day   Drug use:  No   Sexual activity: Not on file  Other Topics Concern   Not on file  Social History Narrative   Not on file   Social Drivers of Health   Financial Resource Strain: Low Risk  (03/07/2024)   Overall Financial Resource Strain (CARDIA)    Difficulty of Paying Living Expenses: Not hard at all  Food Insecurity: No Food Insecurity (03/07/2024)   Hunger Vital Sign    Worried About Running Out of Food in the Last Year: Never true    Ran Out of Food in the Last Year: Never true  Transportation Needs: No Transportation Needs (03/07/2024)   PRAPARE - Scientist, research (physical sciences) (Medical): No    Lack of Transportation (Non-Medical): No  Physical Activity: Insufficiently Active (03/07/2024)   Exercise Vital Sign    Days of Exercise per Week: 3 days    Minutes of Exercise per Session: 30 min  Stress: No Stress Concern Present (03/07/2024)   [employer] of Occupational Health - Occupational Stress Questionnaire    Feeling of Stress : Not at all  Social Connections: Socially Integrated (03/07/2024)   Social Connection and Isolation Panel [NHANES]    Frequency of Communication with Friends and Family: More than three times a week    Frequency of Social Gatherings with Friends and Family: Three times a week    Attends Religious Services: More than 4 times per year    Active Member of Clubs or Organizations: Yes    Attends Engineer, structural: More than 4 times per year    Marital Status: Married    Tobacco Counseling Ready to quit: Not Answered Counseling given: Not Answered    Clinical Intake:  Pre-visit preparation completed: Yes  Pain : No/denies pain     Diabetes: No  Lab Results  Component Value Date   HGBA1C 5.8 (H) 03/07/2023   HGBA1C 5.1 05/08/2018   HGBA1C 4.9 11/26/2017     How often do you need to have someone help you when you read instructions, pamphlets, or other written materials from your doctor or pharmacy?: 1 - Never  Interpreter Needed?: No  Information entered by :: Seabron Cypress LPN   Activities of Daily Living     03/07/2024   10:06 AM  In your present state of health, do you have any difficulty performing the following activities:  Hearing? 0  Vision? 0  Difficulty concentrating or making decisions? 0  Walking or climbing stairs? 0  Dressing or bathing? 0  Doing errands, shopping? 0  Preparing Food and eating ? N  Using the Toilet? N  In the past six months, have you accidently leaked urine? N  Do you have problems with loss of bowel control? N  Managing your Medications? N  Managing  your Finances? N  Housekeeping or managing your Housekeeping? N    Patient Care Team: Bennet Brasil, MD as PCP - General (Family Medicine) Denman Fischer, MD (Dermatology)  I have updated your Care Teams any recent Medical Services you may have received from other providers in the past year.     Assessment:    This is a routine wellness examination for Bauer.  Hearing/Vision screen Hearing Screening - Comments:: Denies hearing difficulties   Vision Screening - Comments:: Wears rx glasses - up to date with routine eye exams     Goals Addressed             This Visit's Progress    Remain active  and independent   On track      Depression Screen     03/07/2024   10:07 AM 09/11/2023   10:49 AM 08/28/2022    8:26 AM 02/22/2022    8:13 AM 08/18/2021    9:50 AM 05/26/2020    9:30 AM 05/26/2019    9:27 AM  PHQ 2/9 Scores  PHQ - 2 Score 0 0 0 0 0 0 0    Fall Risk     03/07/2024   10:08 AM 09/11/2023   10:49 AM 08/28/2022    8:26 AM 02/22/2022    8:13 AM 08/18/2021    9:49 AM  Fall Risk   Falls in the past year? 0 0 1 0 0  Number falls in past yr: 0  0  0  Injury with Fall? 0  1  0  Risk for fall due to : No Fall Risks No Fall Risks Impaired balance/gait No Fall Risks No Fall Risks  Follow up Falls prevention discussed;Education provided;Falls evaluation completed Falls evaluation completed Falls evaluation completed;Education provided;Falls prevention discussed Falls evaluation completed Falls evaluation completed    MEDICARE RISK AT HOME:  Medicare Risk at Home Any stairs in or around the home?: No If so, are there any without handrails?: No Home free of loose throw rugs in walkways, pet beds, electrical cords, etc?: Yes Adequate lighting in your home to reduce risk of falls?: Yes Life alert?: No Use of a cane, walker or w/c?: No Grab bars in the bathroom?: Yes Shower chair or bench in shower?: No Elevated toilet seat or a handicapped toilet?: Yes  TIMED UP AND  GO:  Was the test performed?  No  Cognitive Function: 6CIT completed        03/07/2024   10:08 AM  6CIT Screen  What Year? 0 points  What month? 0 points  What time? 0 points  Count back from 20 0 points  Months in reverse 0 points  Repeat phrase 0 points  Total Score 0 points    Immunizations Immunization History  Administered Date(s) Administered   Influenza-Unspecified 08/06/2018, 07/17/2019, 07/09/2020, 08/18/2021, 08/16/2022   Moderna Covid-19 Vaccine Bivalent Booster 47yrs & up 06/29/2021   Moderna Sars-Covid-2 Vaccination 10/16/2019, 11/12/2019, 08/10/2020   Pneumococcal Conjugate-13 03/12/2014   Pneumococcal Polysaccharide-23 04/22/2015   Tdap 01/01/2011   Zoster Recombinant(Shingrix) 12/24/2017, 05/23/2018   Zoster, Live 11/02/2009    Screening Tests Health Maintenance  Topic Date Due   DTaP/Tdap/Td (2 - Td or Tdap) 12/31/2020   COVID-19 Vaccine (5 - 2024-25 season) 06/03/2023   INFLUENZA VACCINE  05/02/2024   Medicare Annual Wellness (AWV)  03/07/2025   Pneumonia Vaccine 106+ Years old  Completed   Hepatitis C Screening  Completed   Zoster Vaccines- Shingrix  Completed   HPV VACCINES  Aged Out   Meningococcal B Vaccine  Aged Out   Colonoscopy  Discontinued    Health Maintenance  Health Maintenance Due  Topic Date Due   DTaP/Tdap/Td (2 - Td or Tdap) 12/31/2020   COVID-19 Vaccine (5 - 2024-25 season) 06/03/2023    Additional Screening:  Vision Screening: Recommended annual ophthalmology exams for early detection of glaucoma and other disorders of the eye. Would you like a referral to an eye doctor? No    Dental Screening: Recommended annual dental exams for proper oral hygiene  Community Resource Referral / Chronic Care Management: CRR required this visit?  No   CCM required this visit?  No   Plan:    I  have personally reviewed and noted the following in the patient's chart:   Medical and social history Use of alcohol, tobacco or illicit  drugs  Current medications and supplements including opioid prescriptions. Patient is not currently taking opioid prescriptions. Functional ability and status Nutritional status Physical activity Advanced directives List of other physicians Hospitalizations, surgeries, and ER visits in previous 12 months Vitals Screenings to include cognitive, depression, and falls Referrals and appointments  In addition, I have reviewed and discussed with patient certain preventive protocols, quality metrics, and best practice recommendations. A written personalized care plan for preventive services as well as general preventive health recommendations were provided to patient.   Seabron Cypress [place], [place]   05/06/1659   After Visit Summary: (MyChart) Due to this being a telephonic visit, the after visit summary with patients personalized plan was offered to patient via MyChart   Notes: Nothing significant to report at this time.

## 2024-03-07 NOTE — Patient Instructions (Signed)
 Shane Young , Thank you for taking time out of your busy schedule to complete your Annual Wellness Visit with me. I enjoyed our conversation and look forward to speaking with you again next year. I, as well as your care team,  appreciate your ongoing commitment to your health goals. Please review the following plan we discussed and let me know if I can assist you in the future. Your Game plan/ To Do List    Follow up Visits: Next Medicare AWV with our clinical staff: In 1 year    Have you seen your provider in the last 6 months (3 months if uncontrolled diabetes)? Yes Next Office Visit with your provider: 03/12/24 @ 11  Clinician Recommendations:  Aim for 30 minutes of exercise or brisk walking, 6-8 glasses of water , and 5 servings of fruits and vegetables each day.       This is a list of the screening recommended for you and due dates:  Health Maintenance  Topic Date Due   DTaP/Tdap/Td vaccine (2 - Td or Tdap) 12/31/2020   COVID-19 Vaccine (5 - 2024-25 season) 06/03/2023   Flu Shot  05/02/2024   Medicare Annual Wellness Visit  03/07/2025   Pneumonia Vaccine  Completed   Hepatitis C Screening  Completed   Zoster (Shingles) Vaccine  Completed   HPV Vaccine  Aged Out   Meningitis B Vaccine  Aged Out   Colon Cancer Screening  Discontinued    Advanced directives: (ACP Link)Information on Advanced Care Planning can be found at [place]  Secretary of [facility] Advance Health Care Directives Advance Health Care Directives. [email or web address]   Advance Care Planning is important because it:  [x]  Makes sure you receive the medical care that is consistent with your values, goals, and preferences  [x]  It provides guidance to your family and loved ones and reduces their decisional burden about whether or not they are making the right decisions based on your wishes.  Follow the link provided in your after visit summary or read over the paperwork we have mailed to you to help you started getting your  Advance Directives in place. If you need assistance in completing these, please reach out to us  so that we can help you!  See attachments for Preventive Care and Fall Prevention Tips.

## 2024-03-12 ENCOUNTER — Encounter: Payer: Self-pay | Admitting: Family Medicine

## 2024-03-12 ENCOUNTER — Ambulatory Visit: Payer: Medicare PPO | Admitting: Family Medicine

## 2024-03-12 VITALS — BP 122/68 | HR 76 | Temp 98.6°F | Ht 68.0 in | Wt 180.0 lb

## 2024-03-12 DIAGNOSIS — R7303 Prediabetes: Secondary | ICD-10-CM | POA: Diagnosis not present

## 2024-03-12 DIAGNOSIS — Z125 Encounter for screening for malignant neoplasm of prostate: Secondary | ICD-10-CM | POA: Diagnosis not present

## 2024-03-12 DIAGNOSIS — R0981 Nasal congestion: Secondary | ICD-10-CM | POA: Diagnosis not present

## 2024-03-12 DIAGNOSIS — I10 Essential (primary) hypertension: Secondary | ICD-10-CM | POA: Diagnosis not present

## 2024-03-12 DIAGNOSIS — Z79899 Other long term (current) drug therapy: Secondary | ICD-10-CM

## 2024-03-12 DIAGNOSIS — E782 Mixed hyperlipidemia: Secondary | ICD-10-CM

## 2024-03-12 MED ORDER — LISINOPRIL 5 MG PO TABS: 5.0000 mg | ORAL_TABLET | Freq: Every day | ORAL | 1 refills | Status: DC

## 2024-03-12 MED ORDER — ATORVASTATIN CALCIUM 20 MG PO TABS: 20.0000 mg | ORAL_TABLET | Freq: Every day | ORAL | 1 refills | Status: DC

## 2024-03-12 MED ORDER — FLUTICASONE PROPIONATE 50 MCG/ACT NA SUSP: 2.0000 | Freq: Every day | NASAL | 6 refills | Status: AC

## 2024-03-12 NOTE — Addendum Note (Signed)
 Addended byHugo Maes on: 03/12/2024 01:32 PM   Modules accepted: Orders

## 2024-03-12 NOTE — Progress Notes (Signed)
   Subjective:    Patient ID: Shane Young, male    DOB: 04/25/1946, 78 y.o.   MRN: [id number]  HPI Patient with head congestion sinus pressure with some drainage denies high fever chills sweats  Patient for blood pressure check up.  The patient does have hypertension.   Patient relates dietary measures try to minimize salt The importance of healthy diet and activity were discussed Patient relates compliance  Patient here for follow-up regarding cholesterol.    Patient relates taking medication on a regular basis Denies problems with medication Importance of dietary measures discussed Regular lab work regarding lipid and liver was checked and if needing additional labs was appropriately ordered  Patient relates he coughs up a lot of phlegm in the morning and some generally cleared thinks it is more from his head not from his lungs denies shortness of breath does try Flonase  but has not tried Claritin   Review of Systems     Objective:   Physical Exam General-in no acute distress Eyes-no discharge Lungs-respiratory rate normal, CTA CV-no murmurs,RRR Extremities skin warm dry no edema Neuro grossly normal Behavior normal, alert    Assessment & Plan:  1. Mixed hyperlipidemia Continue medication check lab work await results. - atorvastatin  (LIPITOR) 20 MG tablet; Take 1 tablet (20 mg total) by mouth daily.  Dispense: 90 tablet; Refill: 1 - lisinopril  (ZESTRIL ) 5 MG tablet; Take 1 tablet (5 mg total) by mouth daily.  Dispense: 90 tablet; Refill: 1  2. Essential hypertension, benign HTN- patient seen for follow-up regarding HTN.   Diet, medication compliance, appropriate labs and refills were completed.   Importance of keeping blood pressure under good control to lessen the risk of complications discussed Regular follow-up visits discussed  - atorvastatin  (LIPITOR) 20 MG tablet; Take 1 tablet (20 mg total) by mouth daily.  Dispense: 90 tablet; Refill: 1 - lisinopril   (ZESTRIL ) 5 MG tablet; Take 1 tablet (5 mg total) by mouth daily.  Dispense: 90 tablet; Refill: 1  3. Prediabetes (Primary) Healthy diet regular activity try to minimize starches check A1c  4. Sinus congestion Flonase  as well as Claritin if ongoing troubles notify us  we will help set up with ENT  Follow-up 6 months Patient to do lab work within the next 6 weeks

## 2024-07-03 ENCOUNTER — Encounter (INDEPENDENT_AMBULATORY_CARE_PROVIDER_SITE_OTHER): Payer: Self-pay | Admitting: *Deleted

## 2024-07-28 DIAGNOSIS — H5203 Hypermetropia, bilateral: Secondary | ICD-10-CM | POA: Diagnosis not present

## 2024-07-28 DIAGNOSIS — H524 Presbyopia: Secondary | ICD-10-CM | POA: Diagnosis not present

## 2024-07-28 DIAGNOSIS — H2513 Age-related nuclear cataract, bilateral: Secondary | ICD-10-CM | POA: Diagnosis not present

## 2024-07-28 DIAGNOSIS — H52223 Regular astigmatism, bilateral: Secondary | ICD-10-CM | POA: Diagnosis not present

## 2024-07-28 DIAGNOSIS — H25013 Cortical age-related cataract, bilateral: Secondary | ICD-10-CM | POA: Diagnosis not present

## 2024-07-28 DIAGNOSIS — H40013 Open angle with borderline findings, low risk, bilateral: Secondary | ICD-10-CM | POA: Diagnosis not present

## 2024-09-11 ENCOUNTER — Other Ambulatory Visit: Payer: Self-pay

## 2024-09-11 ENCOUNTER — Ambulatory Visit: Admitting: Family Medicine

## 2024-09-11 ENCOUNTER — Encounter: Payer: Self-pay | Admitting: Family Medicine

## 2024-09-11 VITALS — BP 144/78 | HR 76 | Temp 98.6°F | Ht 68.0 in | Wt 184.0 lb

## 2024-09-11 DIAGNOSIS — I10 Essential (primary) hypertension: Secondary | ICD-10-CM

## 2024-09-11 DIAGNOSIS — R7303 Prediabetes: Secondary | ICD-10-CM | POA: Diagnosis not present

## 2024-09-11 DIAGNOSIS — I491 Atrial premature depolarization: Secondary | ICD-10-CM | POA: Diagnosis not present

## 2024-09-11 DIAGNOSIS — E782 Mixed hyperlipidemia: Secondary | ICD-10-CM | POA: Diagnosis not present

## 2024-09-11 DIAGNOSIS — R0981 Nasal congestion: Secondary | ICD-10-CM | POA: Diagnosis not present

## 2024-09-11 DIAGNOSIS — Z125 Encounter for screening for malignant neoplasm of prostate: Secondary | ICD-10-CM

## 2024-09-11 DIAGNOSIS — Z79899 Other long term (current) drug therapy: Secondary | ICD-10-CM | POA: Diagnosis not present

## 2024-09-11 NOTE — Progress Notes (Signed)
° °  Subjective:    Patient ID: Shane Young, male    DOB: [birth year], 78 y.o.   MRN: [id number]  HPI  work just to see almost 70 and then will sleep six 5-1/2 6 5  in a.m. and then call it a day patient takes cholesterol medicine regular basis tries to eat healthy States moods are overall doing well Takes his blood pressure medicine and tries to minimize salt Stays physically active with his work    Review of Systems     Objective:   Physical Exam General-in no acute distress Eyes-no discharge Lungs-respiratory rate normal, CTA CV-no murmurs,RRR Extremities skin warm dry no edema Neuro grossly normal Behavior normal, alert  When I listen to his heart he has what appears to be extra beats which could well be PACs versus PVCs I doubt atrial fibrillation      Assessment & Plan:  1. Mixed hyperlipidemia (Primary) Check lipid profile continue medication - Lipid Panel  2. Essential hypertension, benign Check blood pressure on a regular basis, continue medication, check labs - Basic metabolic panel with GFR - Microalbumin/Creatinine Ratio, Urine  3. Prediabetes Check A1c healthy diet - Hemoglobin A1c  4. High risk medication use Check liver function due to medication - Hepatic function panel  5. Screening PSA (prostate specific antigen) Check PSA - PSA  6. Sinus congestion Recommend humidifier  7. PAC (premature atrial contraction) Occasional PAC EKG does not show any worrisome rhythm Does show extra beat periodically No need for any type of intervention otherwise

## 2024-09-17 LAB — HEPATIC FUNCTION PANEL
ALT: 19 IU/L (ref 0–44)
AST: 17 IU/L (ref 0–40)
Albumin: 4.6 g/dL (ref 3.8–4.8)
Alkaline Phosphatase: 77 IU/L (ref 47–123)
Bilirubin Total: 0.4 mg/dL (ref 0.0–1.2)
Bilirubin, Direct: 0.15 mg/dL (ref 0.00–0.40)
Total Protein: 6.9 g/dL (ref 6.0–8.5)

## 2024-09-17 LAB — BASIC METABOLIC PANEL WITH GFR
BUN/Creatinine Ratio: 17 (ref 10–24)
BUN: 18 mg/dL (ref 8–27)
CO2: 23 mmol/L (ref 20–29)
Calcium: 9.5 mg/dL (ref 8.6–10.2)
Chloride: 102 mmol/L (ref 96–106)
Creatinine, Ser: 1.04 mg/dL (ref 0.76–1.27)
Glucose: 114 mg/dL — ABNORMAL HIGH (ref 70–99)
Potassium: 4.7 mmol/L (ref 3.5–5.2)
Sodium: 140 mmol/L (ref 134–144)
eGFR: 74 mL/min/1.73 (ref >59)

## 2024-09-17 LAB — LIPID PANEL
Chol/HDL Ratio: 2.9 ratio (ref 0.0–5.0)
Cholesterol, Total: 123 mg/dL (ref 100–199)
HDL: 42 mg/dL (ref >39)
LDL Chol Calc (NIH): 66 mg/dL (ref 0–99)
Triglycerides: 73 mg/dL (ref 0–149)
VLDL Cholesterol Cal: 15 mg/dL (ref 5–40)

## 2024-09-17 LAB — HEMOGLOBIN A1C
Est. average glucose Bld gHb Est-mCnc: 114 mg/dL
Hgb A1c MFr Bld: 5.6 % (ref 4.8–5.6)

## 2024-09-17 LAB — PSA: Prostate Specific Ag, Serum: 4.3 ng/mL — ABNORMAL HIGH (ref 0.0–4.0)

## 2024-09-17 LAB — MICROALBUMIN / CREATININE URINE RATIO
Creatinine, Urine: 157.7 mg/dL
Microalb/Creat Ratio: 24 mg/g{creat} (ref 0–29)
Microalbumin, Urine: 38.1 ug/mL

## 2024-10-01 ENCOUNTER — Ambulatory Visit: Payer: Self-pay | Admitting: Family Medicine

## 2024-10-01 ENCOUNTER — Other Ambulatory Visit: Payer: Self-pay | Admitting: Family Medicine

## 2024-10-01 ENCOUNTER — Encounter: Payer: Self-pay | Admitting: Family Medicine

## 2024-10-01 DIAGNOSIS — R972 Elevated prostate specific antigen [PSA]: Secondary | ICD-10-CM

## 2024-10-16 ENCOUNTER — Other Ambulatory Visit: Payer: Self-pay | Admitting: Family Medicine

## 2024-10-16 DIAGNOSIS — I10 Essential (primary) hypertension: Secondary | ICD-10-CM

## 2024-10-16 DIAGNOSIS — E782 Mixed hyperlipidemia: Secondary | ICD-10-CM

## 2024-10-21 LAB — PSA: Prostate Specific Ag, Serum: 3.6 ng/mL (ref 0.0–4.0)

## 2024-10-30 ENCOUNTER — Telehealth: Payer: Self-pay | Admitting: Family Medicine

## 2024-10-30 ENCOUNTER — Ambulatory Visit: Payer: Self-pay | Admitting: Family Medicine

## 2024-10-30 ENCOUNTER — Other Ambulatory Visit: Payer: Self-pay | Admitting: Family Medicine

## 2024-10-30 ENCOUNTER — Encounter: Payer: Self-pay | Admitting: Family Medicine

## 2024-10-30 NOTE — Progress Notes (Signed)
 Please mail to patient he does not use MyChart regularly

## 2024-10-30 NOTE — Telephone Encounter (Signed)
 The patient will need to do a follow-up office visit with me if possible in July if not possible July early August.  Please send me a message back when his appointment is so I can put in his lab orders  Essentially he will need to do labs about 1 week before his office visit Thanks-Dr. Glendia

## 2024-11-04 ENCOUNTER — Telehealth: Payer: Self-pay | Admitting: *Deleted

## 2024-11-04 NOTE — Telephone Encounter (Addendum)
 Wife called and send they had gotten a letter that is is time to schedule their TCS.  Last letter I see was 07/2024.  Do yall have this?

## 2024-12-18 ENCOUNTER — Ambulatory Visit: Admitting: Gastroenterology

## 2025-03-12 ENCOUNTER — Ambulatory Visit: Admitting: Family Medicine

## 2025-03-13 ENCOUNTER — Ambulatory Visit

## 2025-04-01 ENCOUNTER — Ambulatory Visit: Admitting: Family Medicine
